# Patient Record
Sex: Female | Born: 1970 | Race: White | Hispanic: No | Marital: Married | Smoking: Never smoker
Health system: Southern US, Community
[De-identification: ages and names within clinical notes are randomized; demographics above are authoritative.]

## PROBLEM LIST (undated history)

## (undated) DIAGNOSIS — G4730 Sleep apnea, unspecified: Secondary | ICD-10-CM

## (undated) DIAGNOSIS — E079 Disorder of thyroid, unspecified: Secondary | ICD-10-CM

## (undated) DIAGNOSIS — E119 Type 2 diabetes mellitus without complications: Secondary | ICD-10-CM

## (undated) DIAGNOSIS — I10 Essential (primary) hypertension: Secondary | ICD-10-CM

## (undated) DIAGNOSIS — E039 Hypothyroidism, unspecified: Secondary | ICD-10-CM

## (undated) DIAGNOSIS — M797 Fibromyalgia: Secondary | ICD-10-CM

## (undated) HISTORY — DX: Hypothyroidism, unspecified: E03.9

## (undated) HISTORY — DX: Essential (primary) hypertension: I10

## (undated) HISTORY — DX: Type 2 diabetes mellitus without complications: E11.9

## (undated) HISTORY — PX: ACHILLES TENDON REPAIR: SUR1153

## (undated) HISTORY — PX: LAPAROSCOPIC ENDOMETRIOSIS FULGURATION: SUR769

## (undated) HISTORY — PX: CARDIAC CATHETERIZATION: SHX172

## (undated) HISTORY — PX: PLANTAR FASCIA SURGERY: SHX746

## (undated) HISTORY — PX: ABDOMINAL HYSTERECTOMY: SHX81

## (undated) HISTORY — PX: PARTIAL HYSTERECTOMY: SHX80

## (undated) HISTORY — DX: Fibromyalgia: M79.7

## (undated) HISTORY — DX: Disorder of thyroid, unspecified: E07.9

## (undated) HISTORY — PX: CHOLECYSTECTOMY: SHX55

---

## 1999-11-14 ENCOUNTER — Other Ambulatory Visit: Admission: RE | Admit: 1999-11-14 | Discharge: 1999-11-14 | Payer: Self-pay | Admitting: Obstetrics & Gynecology

## 2001-08-25 ENCOUNTER — Inpatient Hospital Stay (HOSPITAL_COMMUNITY): Admission: RE | Admit: 2001-08-25 | Discharge: 2001-08-26 | Payer: Self-pay | Admitting: Obstetrics & Gynecology

## 2010-07-14 ENCOUNTER — Emergency Department: Payer: Self-pay | Admitting: Emergency Medicine

## 2010-07-16 ENCOUNTER — Inpatient Hospital Stay: Payer: Self-pay | Admitting: Surgery

## 2010-08-16 ENCOUNTER — Ambulatory Visit: Payer: Self-pay | Admitting: Surgery

## 2010-09-12 ENCOUNTER — Ambulatory Visit: Payer: Self-pay | Admitting: Surgery

## 2010-09-12 ENCOUNTER — Inpatient Hospital Stay: Payer: Self-pay | Admitting: Internal Medicine

## 2010-09-19 ENCOUNTER — Inpatient Hospital Stay: Payer: Self-pay | Admitting: Surgery

## 2010-09-20 LAB — PATHOLOGY REPORT

## 2011-08-12 HISTORY — PX: COLON SURGERY: SHX602

## 2012-08-09 ENCOUNTER — Emergency Department: Payer: Self-pay | Admitting: Emergency Medicine

## 2012-08-09 LAB — URINALYSIS, COMPLETE
Glucose,UR: NEGATIVE mg/dL (ref 0–75)
Leukocyte Esterase: NEGATIVE
Nitrite: NEGATIVE
Protein: NEGATIVE
RBC,UR: 1 /HPF (ref 0–5)
WBC UR: <1 /HPF (ref 0–5)

## 2012-08-09 LAB — COMPREHENSIVE METABOLIC PANEL
Albumin: 4.1 g/dL (ref 3.4–5.0)
Anion Gap: 10 (ref 7–16)
BUN: 13 mg/dL (ref 7–18)
Bilirubin,Total: 0.3 mg/dL (ref 0.2–1.0)
Chloride: 103 mmol/L (ref 98–107)
Creatinine: 0.95 mg/dL (ref 0.60–1.30)
EGFR (African American): >60
EGFR (Non-African Amer.): >60
Glucose: 116 mg/dL — ABNORMAL HIGH (ref 65–99)
Potassium: 3.8 mmol/L (ref 3.5–5.1)
SGOT(AST): 28 U/L (ref 15–37)
SGPT (ALT): 35 U/L (ref 12–78)
Total Protein: 8.9 g/dL — ABNORMAL HIGH (ref 6.4–8.2)

## 2012-08-09 LAB — CBC
HCT: 46.1 % (ref 35.0–47.0)
HGB: 16 g/dL (ref 12.0–16.0)
MCH: 29.4 pg (ref 26.0–34.0)
MCHC: 34.7 g/dL (ref 32.0–36.0)
MCV: 85 fL (ref 80–100)
Platelet: 319 10*3/uL (ref 150–440)
RDW: 14 % (ref 11.5–14.5)
WBC: 18.5 10*3/uL — ABNORMAL HIGH (ref 3.6–11.0)

## 2012-08-09 LAB — LIPASE, BLOOD: Lipase: 233 U/L (ref 73–393)

## 2012-08-11 HISTORY — PX: HERNIA REPAIR: SHX51

## 2012-08-30 ENCOUNTER — Ambulatory Visit: Payer: Self-pay | Admitting: Surgery

## 2012-08-30 LAB — CBC WITH DIFFERENTIAL/PLATELET
Basophil #: 0 10*3/uL (ref 0.0–0.1)
Basophil %: 0.4 %
Eosinophil #: 0.2 10*3/uL (ref 0.0–0.7)
Eosinophil %: 1.4 %
HCT: 42.5 % (ref 35.0–47.0)
Lymphocyte #: 2.9 10*3/uL (ref 1.0–3.6)
Lymphocyte %: 27.1 %
MCHC: 33.6 g/dL (ref 32.0–36.0)
Monocyte #: 0.7 x10 3/mm (ref 0.2–0.9)
Neutrophil #: 7 10*3/uL — ABNORMAL HIGH (ref 1.4–6.5)
Platelet: 271 10*3/uL (ref 150–440)
RBC: 4.95 10*6/uL (ref 3.80–5.20)
WBC: 10.8 10*3/uL (ref 3.6–11.0)

## 2012-08-30 LAB — BASIC METABOLIC PANEL
Anion Gap: 5 — ABNORMAL LOW (ref 7–16)
Calcium, Total: 10.2 mg/dL — ABNORMAL HIGH (ref 8.5–10.1)
Chloride: 104 mmol/L (ref 98–107)
Creatinine: 0.82 mg/dL (ref 0.60–1.30)
Glucose: 118 mg/dL — ABNORMAL HIGH (ref 65–99)
Sodium: 139 mmol/L (ref 136–145)

## 2012-09-08 ENCOUNTER — Inpatient Hospital Stay: Payer: Self-pay | Admitting: Surgery

## 2012-09-08 LAB — CREATININE, SERUM
Creatinine: 0.99 mg/dL (ref 0.60–1.30)
EGFR (African American): >60

## 2012-09-09 LAB — CBC WITH DIFFERENTIAL/PLATELET
Basophil %: 0.2 %
HCT: 35 % (ref 35.0–47.0)
HGB: 11.7 g/dL — ABNORMAL LOW (ref 12.0–16.0)
Lymphocyte %: 11.5 %
MCH: 29 pg (ref 26.0–34.0)
MCHC: 33.6 g/dL (ref 32.0–36.0)
MCV: 86 fL (ref 80–100)
Monocyte #: 1.1 x10 3/mm — ABNORMAL HIGH (ref 0.2–0.9)
Monocyte %: 7.8 %
Platelet: 255 10*3/uL (ref 150–440)
WBC: 14.6 10*3/uL — ABNORMAL HIGH (ref 3.6–11.0)

## 2012-09-09 LAB — BASIC METABOLIC PANEL
Chloride: 103 mmol/L (ref 98–107)
EGFR (African American): >60
Sodium: 136 mmol/L (ref 136–145)

## 2012-09-13 LAB — CREATININE, SERUM
Creatinine: 0.83 mg/dL (ref 0.60–1.30)
EGFR (African American): >60

## 2013-10-11 DIAGNOSIS — M779 Enthesopathy, unspecified: Secondary | ICD-10-CM

## 2014-11-10 DIAGNOSIS — R52 Pain, unspecified: Secondary | ICD-10-CM

## 2014-12-01 NOTE — Discharge Summary (Signed)
PATIENT NAME:  [name], Denise Daniels MR#:  [id number] DATE OF BIRTH:  1970/10/27  DATE OF ADMISSION:  09/08/2012 DATE OF DISCHARGE:  09/13/2012  BRIEF HISTORY AND HOSPITAL COURSE: Denise Daniels is a 44 year old woman with a large ventral hernia admitted for elective ventral hernia repair. After preoperative preparation and informed consent she was taken to surgery on the morning of 09/08/2012 where she underwent a ventral hernia repair using Atrium C-QUR mesh and component separation. The procedure was uncomplicated. She had some mild pulmonary problems postoperatively from the increased abdominal pressure and her underlying lung disease. JP drains were placed which drained a moderate amount of fluid postoperatively. She had very slow return of bowel function. Pain control was an issue, but she has slowly improved over the last 48 hours. Her wounds look good and there is no sign of any infection or recurrence. Drainage is decreasing but we will send her home with her JP drains in place. She will be followed in the office in 3 to 5 days' time.   DISCHARGE MEDICATIONS: 1. Hydrochlorothiazide 25 mg/triamterene 37.5 mg p.o. q. day. 2. Levothyroxine 0.125 mg p.o. q. day.  3. Effexor-XR 75 mg p.o. at bedtime. 4. Atenolol 25 mg p.o. at bedtime. 5. Carisoprodol 350 mg p.o. q. day p.r.n. 6. Percocet 5/325 mg for postoperative pain control.  FINAL DISCHARGE DIAGNOSIS: Ventral hernia.   SURGERY: Ventral hernia repair. ____________________________ Micheline Maze, MD rle:sb D: 09/13/2012 09:00:52 ET    T: 09/13/2012 12:11:30 ET       JOB#: [id number] cc: Rodena Goldmann III, MD, <Dictator> Dr. Paulita Cradle MD ELECTRONICALLY SIGNED 09/22/2012 18:04

## 2014-12-01 NOTE — Op Note (Signed)
PATIENT NAME:  Denise Daniels, Denise Daniels MR#:  [id number] DATE OF BIRTH:  09/01/1970  DATE OF PROCEDURE:  09/07/2012  PREOPERATIVE DIAGNOSIS: Ventral hernia.   POSTOPERATIVE DIAGNOSIS: Ventral hernia.   OPERATION: Ventral hernia repair with component separation.   SURGEON: Rodena Goldmann, M.D.   ASSISTANT: [place]:, PA student.   ANESTHESIA: General.   OPERATIVE PROCEDURE: With the patient in the supine position after the induction of appropriate general anesthesia, the patient's abdomen was prepped with ChloraPrep and draped with sterile towels. The previous upper midline incision was excised as it had become significantly distended over time. This was carried down through the subcutaneous tissue with Bovie electrocautery. The sac was identified and dissected back to its origin in the abdominal wall. The defect was quite large and so extensive that subcutaneous dissection was required. Once the complete sac was isolated, the fascia identified and sides of the sac was opened, taken back to the anterior abdominal wall and removed. Multiple adhesions to the colon and omentum were taken down. Minimus preserved. Because of the large defect measured at 15 x 12 cm, the anterior abdominal wall components were separated laterally to provide for release of the anterior fascia. The idea behind this maneuver was designed to provide mobility to the anterior abdominal wall to allow the natural tissue to cover the mesh.  Atrium C-QUR mesh was brought to the table. A 25 x 20 piece was utilized overlapping in every direction. The mesh was sutured in place with vertical mattress sutures of 0 Prolene. The fascia was then closed over the mesh using a figure-of-eight suture of 0 Maxon.   The area was copiously irrigated. No significant bleeding was encountered. Two round Blake drains were placed to control any drainage in the dead space. Drains were secured with 3-0 nylon. Skin staples were applied. Sterile dressings were applied. An  abdominal binder was placed. The patient was returned to the recovery room having tolerated the procedure well. Sponge, instrument and needle counts were correct x 2 in the operating room.      ____________________________ Rodena Goldmann III, MD rle:ct D: 09/07/2012 10:26:23 ET T: 09/07/2012 12:22:33 ET JOB#: [id number]  cc: Rodena Goldmann III, MD, <Dictator> Rodena Goldmann MD ELECTRONICALLY SIGNED 09/08/2012 6:08

## 2015-01-10 DIAGNOSIS — E119 Type 2 diabetes mellitus without complications: Secondary | ICD-10-CM

## 2015-01-10 HISTORY — DX: Type 2 diabetes mellitus without complications: E11.9

## 2015-09-06 DIAGNOSIS — Z139 Encounter for screening, unspecified: Secondary | ICD-10-CM

## 2015-09-17 ENCOUNTER — Ambulatory Visit: Payer: Self-pay | Admitting: Physician Assistant

## 2015-09-17 ENCOUNTER — Encounter: Payer: Self-pay | Admitting: Physician Assistant

## 2015-09-17 VITALS — BP 136/88 | HR 75 | Temp 98.2°F | Ht 65.5 in | Wt 287.8 lb

## 2015-09-17 DIAGNOSIS — R109 Unspecified abdominal pain: Secondary | ICD-10-CM

## 2015-09-17 DIAGNOSIS — E119 Type 2 diabetes mellitus without complications: Secondary | ICD-10-CM

## 2015-09-17 DIAGNOSIS — Z13220 Encounter for screening for lipoid disorders: Secondary | ICD-10-CM

## 2015-09-17 DIAGNOSIS — M797 Fibromyalgia: Secondary | ICD-10-CM

## 2015-09-17 DIAGNOSIS — Z131 Encounter for screening for diabetes mellitus: Secondary | ICD-10-CM

## 2015-09-17 DIAGNOSIS — R10A0 Flank pain, unspecified side: Secondary | ICD-10-CM

## 2015-09-17 DIAGNOSIS — G8929 Other chronic pain: Secondary | ICD-10-CM

## 2015-09-17 DIAGNOSIS — E039 Hypothyroidism, unspecified: Secondary | ICD-10-CM

## 2015-09-17 DIAGNOSIS — I10 Essential (primary) hypertension: Secondary | ICD-10-CM

## 2015-09-17 LAB — POCT URINALYSIS DIPSTICK
Bilirubin, UA: NEGATIVE
Blood, UA: NEGATIVE
Glucose, UA: NEGATIVE
KETONES UA: NEGATIVE
LEUKOCYTES UA: NEGATIVE
Nitrite, UA: NEGATIVE
PH UA: 7
PROTEIN UA: NEGATIVE
SPEC GRAV UA: 1.025
UROBILINOGEN UA: 0.2

## 2015-09-17 LAB — GLUCOSE, POCT (MANUAL RESULT ENTRY): POC GLUCOSE: 149 mg/dL — AB (ref 70–99)

## 2015-09-17 NOTE — Progress Notes (Signed)
BP 136/88 mmHg  Pulse 75  Temp(Src) 98.2 F (36.8 C)  Ht 5' 5.5" (1.664 m)  Wt 287 lb 12 oz (130.523 kg)  BMI 47.14 kg/m2  SpO2 96%   Subjective:    Patient ID: Denise Daniels, female    DOB: 1970/10/11, 45 y.o.   MRN: [id number]  HPI: Denise Daniels is a 45 y.o. female presenting on 09/17/2015 for New Patient (Initial Visit)   HPI previously seen at [facility].  She hasn't seen them since June 2016.  Pt uses amoxil "prn rash".    Pt has never had her a1c rechecked after starting the metformin. Her last OV was when she was started on the metformin.   Pt states cardiac cath was normal at some point in the past.  Pt wants to see gyn for pain after intercourse.  Pt is s/p hysterectomy.  Relevant past medical, surgical, family and social history reviewed and updated as indicated. Interim medical history since our last visit reviewed. Allergies and medications reviewed and updated.   Current outpatient prescriptions:  .  amoxicillin (AMOXIL) 250 MG capsule, Take 250 mg by mouth 2 (two) times daily as needed (for skin)., Disp: , Rfl:  .  atenolol (TENORMIN) 25 MG tablet, Take 25 mg by mouth daily., Disp: , Rfl:  .  carisoprodol (SOMA) 350 MG tablet, Take 350 mg by mouth daily., Disp: , Rfl:  .  ibuprofen (ADVIL,MOTRIN) 200 MG tablet, Take 400 mg by mouth 2 (two) times daily as needed., Disp: , Rfl:  .  levothyroxine (SYNTHROID, LEVOTHROID) 137 MCG tablet, Take 137 mcg by mouth daily before breakfast., Disp: , Rfl:  .  metFORMIN (GLUCOPHAGE) 500 MG tablet, Take by mouth daily before breakfast., Disp: , Rfl:  .  sertraline (ZOLOFT) 100 MG tablet, Take 100 mg by mouth daily., Disp: , Rfl:  .  traMADol (ULTRAM) 50 MG tablet, Take 50-100 mg by mouth every 8 (eight) hours as needed (Fiber Myalgia Flare)., Disp: , Rfl:  .  triamterene-hydrochlorothiazide (DYAZIDE) 37.5-25 MG capsule, Take 1 capsule by mouth daily., Disp: , Rfl:    Review of Systems  Constitutional: Positive  for appetite change and fatigue. Negative for fever, chills, diaphoresis and unexpected weight change.  HENT: Negative for congestion, dental problem, drooling, ear pain, facial swelling, hearing loss, mouth sores, sneezing, sore throat, trouble swallowing and voice change.   Eyes: Positive for visual disturbance. Negative for pain, discharge, redness and itching.  Respiratory: Negative for cough, choking, shortness of breath and wheezing.   Cardiovascular: Positive for palpitations and leg swelling. Negative for chest pain.  Gastrointestinal: Positive for abdominal pain, diarrhea and constipation. Negative for vomiting and blood in stool.  Endocrine: Positive for heat intolerance and polydipsia. Negative for cold intolerance.  Genitourinary: Negative for dysuria, hematuria and decreased urine volume.  Musculoskeletal: Positive for back pain, arthralgias and gait problem.  Skin: Positive for rash.  Allergic/Immunologic: Positive for environmental allergies.  Neurological: Positive for light-headedness and headaches. Negative for seizures and syncope.  Hematological: Negative for adenopathy.  Psychiatric/Behavioral: Positive for dysphoric mood and agitation. Negative for suicidal ideas. The patient is nervous/anxious.     Per HPI unless specifically indicated above     Objective:    BP 136/88 mmHg  Pulse 75  Temp(Src) 98.2 F (36.8 C)  Ht 5' 5.5" (1.664 m)  Wt 287 lb 12 oz (130.523 kg)  BMI 47.14 kg/m2  SpO2 96%  Wt Readings from Last 3 Encounters:  09/17/15 287  lb 12 oz (130.523 kg)    Physical Exam  Constitutional: She is oriented to person, place, and time. She appears well-developed and well-nourished.  HENT:  Head: Normocephalic and atraumatic.  Mouth/Throat: Oropharynx is clear and moist. No oropharyngeal exudate.  Eyes: Conjunctivae and EOM are normal. Pupils are equal, round, and reactive to light.  Neck: Neck supple. No thyromegaly present.  Cardiovascular: Normal rate  and regular rhythm.   Pulmonary/Chest: Effort normal and breath sounds normal.  Abdominal: Soft. Bowel sounds are normal. She exhibits no mass. There is no hepatosplenomegaly. There is no tenderness.  Morbidly obese  Musculoskeletal: She exhibits no edema.  Lymphadenopathy:    She has no cervical adenopathy.  Neurological: She is alert and oriented to person, place, and time. Gait normal.  Skin: Skin is warm and dry.  Psychiatric: She has a normal mood and affect. Her behavior is normal.  Vitals reviewed.   Results for orders placed or performed in visit on 09/17/15  POCT Glucose (CBG)  Result Value Ref Range   POC Glucose 149 (A) 70 - 99 mg/dl      Assessment & Plan:   Encounter Diagnoses  Name Primary?  . Type 2 diabetes mellitus without complication, unspecified long term insulin use status ([place]) Yes  . Flank pain   . Hypothyroidism, unspecified hypothyroidism type   . Essential hypertension, benign   . Chronic pain   . Screening for diabetes mellitus   . Morbid obesity, unspecified obesity type ([place])   . Screening cholesterol level   . Fibromyalgia     -Refer to [facility] for fibromyalgia -Get fasting labs drawn tomorrow -Pt given card for her to Call [facility] for [place] services -continue current meds -F/u 1 mo -foot exam at f/u appointment in one month

## 2015-09-18 LAB — CBC WITH DIFFERENTIAL/PLATELET
BASOS PCT: 0 % (ref 0–1)
Basophils Absolute: 0 10*3/uL (ref 0.0–0.1)
Eosinophils Absolute: 0.1 10*3/uL (ref 0.0–0.7)
Eosinophils Relative: 1 % (ref 0–5)
HEMATOCRIT: 42.6 % (ref 36.0–46.0)
HEMOGLOBIN: 13.8 g/dL (ref 12.0–15.0)
Lymphocytes Relative: 26 % (ref 12–46)
Lymphs Abs: 2.7 10*3/uL (ref 0.7–4.0)
MCH: 28 pg (ref 26.0–34.0)
MCHC: 32.4 g/dL (ref 30.0–36.0)
MCV: 86.6 fL (ref 78.0–100.0)
MONO ABS: 0.7 10*3/uL (ref 0.1–1.0)
MONOS PCT: 7 % (ref 3–12)
MPV: 9.3 fL (ref 8.6–12.4)
NEUTROS ABS: 6.7 10*3/uL (ref 1.7–7.7)
Neutrophils Relative %: 66 % (ref 43–77)
Platelets: 266 10*3/uL (ref 150–400)
RBC: 4.92 MIL/uL (ref 3.87–5.11)
RDW: 14.6 % (ref 11.5–15.5)
WBC: 10.2 10*3/uL (ref 4.0–10.5)

## 2015-09-19 LAB — LIPID PANEL
CHOL/HDL RATIO: 5.4 ratio — AB (ref <=5.0)
Cholesterol: 194 mg/dL (ref 125–200)
HDL: 36 mg/dL — ABNORMAL LOW (ref >=46)
LDL Cholesterol: 130 mg/dL — ABNORMAL HIGH (ref <130)
Triglycerides: 142 mg/dL (ref <150)
VLDL: 28 mg/dL (ref <30)

## 2015-09-19 LAB — COMPLETE METABOLIC PANEL WITH GFR
ALBUMIN: 4.2 g/dL (ref 3.6–5.1)
ALK PHOS: 101 U/L (ref 33–115)
ALT: 20 U/L (ref 6–29)
AST: 20 U/L (ref 10–30)
BUN: 18 mg/dL (ref 7–25)
CO2: 30 mmol/L (ref 20–31)
Calcium: 10 mg/dL (ref 8.6–10.2)
Chloride: 101 mmol/L (ref 98–110)
Creat: 0.92 mg/dL (ref 0.50–1.10)
GFR, Est African American: 88 mL/min (ref >=60)
GFR, Est Non African American: 76 mL/min (ref >=60)
GLUCOSE: 143 mg/dL — AB (ref 65–99)
POTASSIUM: 4.4 mmol/L (ref 3.5–5.3)
SODIUM: 139 mmol/L (ref 135–146)
TOTAL PROTEIN: 7.2 g/dL (ref 6.1–8.1)
Total Bilirubin: 0.4 mg/dL (ref 0.2–1.2)

## 2015-09-19 LAB — TSH: TSH: 3.42 mIU/L

## 2015-09-19 LAB — MICROALBUMIN, URINE: MICROALB UR: 0.8 mg/dL

## 2015-09-19 LAB — HEMOGLOBIN A1C
HEMOGLOBIN A1C: 6.9 % — AB (ref <5.7)
MEAN PLASMA GLUCOSE: 151 mg/dL — AB (ref <117)

## 2015-09-26 NOTE — Congregational Nurse Program (Signed)
Congregational Nurse Program Note  Date of Encounter: 09/06/2015  Past Medical History: Past Medical History  Diagnosis Date  . Hypertension   . Diabetes mellitus without complication ([place]) [id number]  . Fibromyalgia   . Thyroid disease   . Hypothyroidism     Encounter Details:     CNP Questionnaire - 09/06/15 0925    Patient Demographics   Is this a new or existing patient? New   Patient is considered a/an Not Applicable   Race Caucasian/White   Patient Assistance   Location of Patient Assistance [facility], [place]   Patient's financial/insurance status Low Income   Uninsured Patient Yes   Interventions Counseled to make appt. with provider;Assisted patient in making appt.;Averted from ED/Urgent Care   Patient referred to apply for the following financial assistance [place] insecurities addressed Referred to food bank or resource   Transportation assistance No   Assistance securing medications No   Educational health offerings Chronic disease;Diabetes;Exercise/physical activity;Hypertension;Nutrition;Navigating the healthcare system;Behavioral health   Encounter Details   Primary purpose of visit Chronic Illness/Condition Visit;Navigating the Healthcare System   Was an Emergency Department visit averted? Yes   Does patient have a medical provider? No   Patient referred to Area Agency;Clinic;Establish PCP;Medication Assistance Programs   Was a mental health screening completed? (GAINS tool) Yes   Was a mental health referral made? Yes   Does patient have dental issues? No   Does patient have vision issues? Yes   Was a vision referral made? No   Since previous encounter, have you referred patient for abnormal blood pressure that resulted in a new diagnosis or medication change? No   Since previous encounter, have you referred patient for abnormal blood glucose that resulted in a new diagnosis or medication change? No         Amb Nursing  Assessment - 09/06/15 0930    Pre-visit preparation   Pre-visit preparation completed No   Pain Assessment   Pain Assessment 0-10   Pain Score 3    Pain Type Chronic pain   Pain Location Flank   Pain Orientation Left   Pain Descriptors / Indicators Aching   Pain Onset More than a month ago   Pain Frequency Constant   Pain Relieving Factors rest   Effect of Pain on Daily Activities history of chronic pain, fibromyalgia. makes doing ADLs difficult   Nutrition Screen   Diabetes Yes   CBG done? No   Did pt. bring in CBG monitor from home? No   Functional Status   Activities of Daily Living Independent   Ambulation Independent   Medication Administration Independent   Home Management Independent   Risk/Barriers  Assessment   Barriers to Care Management & Learning Emotional barrier;Mental health   Psychosocial Barriers Financial need;Uninsured/under-insured;Transportation   Abuse/Neglect Assessment   Do you feel unsafe in your current relationship? Yes   Do you feel physically threatened by others? No   Anyone hurting you at home, work, or school? No   Unable to ask? No   Information provided on Community resources Yes;Referral to Social worker  referral to MSW intern of [place]. client to follow up with intern on Feb. 2,2017   Language Assistant   Interpreter Needed? No     New client to [facility] program. Client does receive food stamps, and is currently working with an attorney in application for disability. Client currently lives with a friend and states housing there is stable and safe at present. States  friend helps with transportation and bills. She does report a need for further food assistance. Referral made today with the [employer] and application for assistance made. Last medical provider seen was in June of 2016 at [facility] family practice per client. Review of systems assesed. Client currently reports Diabetes , HTN , hypothyroidism, fibromyalgia, GERD. She  currently has medications, she states that the former clinic helped with meds and that her friend helps her with the 4$ meds at [place]. Current med list per client: Triam/HCTZ, levothyroxin, metformin, Atenolol, tramadol, Sertraline, Soma, zantac, advil prn and amoxycillin prn skin rash. PMH: Colon resection in 2013 with abdominal muscle wall repair with mesh in 2014. Gallbladder removal 1996, Paritial hysterectomy 14 years ago, surgery for endometriosis, bilateral plantar fascitis surgery 92,95. Heart cath unknown date, HTN, Fibromyalgia, DM. Client states currently she is unable to afford to go to her previous medical provider due to know insurance or income. Today client presents with complaints of swelling of hands and feet after being up, some extreme weakness with activity, general all over muscle pain, vision difficulty reading that reading glasses have not helped. Acid reflux, complaints of feeling like her bladder doesn't fully empty and that she is up 2 -3 times a night to urinate. Client denies current thoughts of sucide, but reports that over the past 2 mos increased feelings of depression., Isolating herself from others, not eating well, difficulty doing ADLs. Reports increased anxiety with finances, medical issues . Will refer client to MSW intern , appointment made for February 2nd to meet with [facility]. Referral made for PCP with [facility] and appointment secured for February 6th at 9:15am, appointment  Discussed with client diet for control of DM as well as HTN, dicussed less salt in diet and well as decreasing fried, fatty foods and about eating a well balanced diet of vegetables , lean protein and decreasing amount of carbohydrates with meals. [facility] MSW intern discussed basic relaxation techniques. Discussed with client the need for following up with appointments and that if transportation is needed for medical appointments to contact [place] for assistance. Client  states understanding. Also discussed the need for mental health provider , [facility] contact information given to client and she wants to further discuss that when she follows up with MSW intern next week. Will follow up as needed and after initial appointment. Client has RN contact information. Encouraged client to also reapply at [place] for medicaid.

## 2015-10-15 ENCOUNTER — Ambulatory Visit: Payer: Self-pay | Admitting: Physician Assistant

## 2015-10-17 ENCOUNTER — Encounter: Payer: Self-pay | Admitting: Physician Assistant

## 2015-11-26 ENCOUNTER — Ambulatory Visit: Payer: Self-pay | Admitting: Physician Assistant

## 2015-12-03 ENCOUNTER — Ambulatory Visit: Payer: Self-pay | Admitting: Physician Assistant

## 2015-12-03 ENCOUNTER — Encounter: Payer: Self-pay | Admitting: Physician Assistant

## 2015-12-03 VITALS — BP 124/78 | HR 68 | Temp 98.1°F | Ht 65.5 in | Wt 290.0 lb

## 2015-12-03 DIAGNOSIS — I10 Essential (primary) hypertension: Secondary | ICD-10-CM

## 2015-12-03 DIAGNOSIS — E785 Hyperlipidemia, unspecified: Secondary | ICD-10-CM | POA: Insufficient documentation

## 2015-12-03 DIAGNOSIS — G8929 Other chronic pain: Secondary | ICD-10-CM

## 2015-12-03 DIAGNOSIS — E039 Hypothyroidism, unspecified: Secondary | ICD-10-CM

## 2015-12-03 DIAGNOSIS — E119 Type 2 diabetes mellitus without complications: Secondary | ICD-10-CM

## 2015-12-03 MED ORDER — LOVASTATIN 20 MG PO TABS: 20.0000 mg | ORAL_TABLET | Freq: Every day | ORAL | Status: DC

## 2015-12-03 NOTE — Progress Notes (Signed)
BP 124/78 mmHg  Pulse 68  Temp(Src) 98.1 F (36.7 C)  Ht 5' 5.5" (1.664 m)  Wt 290 lb (131.543 kg)  BMI 47.51 kg/m2  SpO2 98%   Subjective:    Patient ID: Denise Daniels, female    DOB: November 28, 1970, 45 y.o.   MRN: [id number]  HPI: Denise Daniels is a 45 y.o. female presenting on 12/03/2015 for Diabetes; Thyroid Problem; Pain; and Mental Health Problem   HPI   Pt has not gone to DM education yet Pt going to Rheumatology in July for chronic pain   Relevant past medical, surgical, family and social history reviewed and updated as indicated. Interim medical history since our last visit reviewed. Allergies and medications reviewed and updated.   Current outpatient prescriptions:  .  amoxicillin (AMOXIL) 250 MG capsule, Take 250 mg by mouth 2 (two) times daily as needed (for skin)., Disp: , Rfl:  .  atenolol (TENORMIN) 25 MG tablet, Take 25 mg by mouth daily., Disp: , Rfl:  .  carisoprodol (SOMA) 350 MG tablet, Take 350 mg by mouth daily., Disp: , Rfl:  .  ibuprofen (ADVIL,MOTRIN) 200 MG tablet, Take 400 mg by mouth 2 (two) times daily as needed., Disp: , Rfl:  .  levothyroxine (SYNTHROID, LEVOTHROID) 137 MCG tablet, Take 137 mcg by mouth daily before breakfast., Disp: , Rfl:  .  metFORMIN (GLUCOPHAGE) 500 MG tablet, Take by mouth daily before breakfast., Disp: , Rfl:  .  sertraline (ZOLOFT) 100 MG tablet, Take 100 mg by mouth daily., Disp: , Rfl:  .  traMADol (ULTRAM) 50 MG tablet, Take 50-100 mg by mouth daily as needed (Fiber Myalgia Flare). , Disp: , Rfl:  .  triamterene-hydrochlorothiazide (DYAZIDE) 37.5-25 MG capsule, Take 1 capsule by mouth daily., Disp: , Rfl:    Review of Systems  Constitutional: Positive for fatigue. Negative for fever, chills, diaphoresis, appetite change and unexpected weight change.  HENT: Negative for congestion, dental problem, drooling, ear pain, facial swelling, hearing loss, mouth sores, sneezing, sore throat, trouble swallowing and voice change.    Eyes: Positive for visual disturbance. Negative for pain, discharge, redness and itching.  Respiratory: Negative for cough, choking, shortness of breath and wheezing.   Cardiovascular: Positive for palpitations and leg swelling. Negative for chest pain.  Gastrointestinal: Negative for vomiting, abdominal pain, diarrhea, constipation and blood in stool.  Endocrine: Positive for heat intolerance. Negative for cold intolerance and polydipsia.  Genitourinary: Negative for dysuria, hematuria and decreased urine volume.  Musculoskeletal: Positive for back pain, arthralgias and gait problem.  Skin: Positive for rash.  Allergic/Immunologic: Negative for environmental allergies.  Neurological: Negative for seizures, syncope, light-headedness and headaches.  Hematological: Negative for adenopathy.  Psychiatric/Behavioral: Positive for dysphoric mood and agitation. Negative for suicidal ideas. The patient is nervous/anxious.     Per HPI unless specifically indicated above     Objective:    BP 124/78 mmHg  Pulse 68  Temp(Src) 98.1 F (36.7 C)  Ht 5' 5.5" (1.664 m)  Wt 290 lb (131.543 kg)  BMI 47.51 kg/m2  SpO2 98%  Wt Readings from Last 3 Encounters:  12/03/15 290 lb (131.543 kg)  09/17/15 287 lb 12 oz (130.523 kg)    Physical Exam  Constitutional: She is oriented to person, place, and time. She appears well-developed and well-nourished.  HENT:  Head: Normocephalic and atraumatic.  Neck: Neck supple.  Cardiovascular: Normal rate and regular rhythm.   Pulmonary/Chest: Effort normal and breath sounds normal.  Abdominal: Soft. Bowel sounds  are normal. She exhibits no mass. There is no hepatosplenomegaly. There is no tenderness.  obese  Musculoskeletal: She exhibits no edema.  Lymphadenopathy:    She has no cervical adenopathy.  Neurological: She is alert and oriented to person, place, and time.  Skin: Skin is warm and dry.  Significant sun exposure all over body  Psychiatric: She has  a normal mood and affect. Her behavior is normal.  Vitals reviewed.  Foot exam done  Results for orders placed or performed in visit on 09/17/15  HgB A1c  Result Value Ref Range   Hgb A1c MFr Bld 6.9 (H) <5.7 %   Mean Plasma Glucose 151 (H) <117 mg/dL  Lipid Profile  Result Value Ref Range   Cholesterol 194 125 - 200 mg/dL   Triglycerides 142 <150 mg/dL   HDL 36 (L) >=46 mg/dL   Total CHOL/HDL Ratio 5.4 (H) <=5.0 Ratio   VLDL 28 <30 mg/dL   LDL Cholesterol 130 (H) <130 mg/dL  COMPLETE METABOLIC PANEL WITH GFR  Result Value Ref Range   Sodium 139 135 - 146 mmol/L   Potassium 4.4 3.5 - 5.3 mmol/L   Chloride 101 98 - 110 mmol/L   CO2 30 20 - 31 mmol/L   Glucose, Bld 143 (H) 65 - 99 mg/dL   BUN 18 7 - 25 mg/dL   Creat 0.92 0.50 - 1.10 mg/dL   Total Bilirubin 0.4 0.2 - 1.2 mg/dL   Alkaline Phosphatase 101 33 - 115 U/L   AST 20 10 - 30 U/L   ALT 20 6 - 29 U/L   Total Protein 7.2 6.1 - 8.1 g/dL   Albumin 4.2 3.6 - 5.1 g/dL   Calcium 10.0 8.6 - 10.2 mg/dL   GFR, Est African American 88 >=60 mL/min   GFR, Est Non African American 76 >=60 mL/min  CBC w/Diff/Platelet  Result Value Ref Range   WBC 10.2 4.0 - 10.5 K/uL   RBC 4.92 3.87 - 5.11 MIL/uL   Hemoglobin 13.8 12.0 - 15.0 g/dL   HCT 42.6 36.0 - 46.0 %   MCV 86.6 78.0 - 100.0 fL   MCH 28.0 26.0 - 34.0 pg   MCHC 32.4 30.0 - 36.0 g/dL   RDW 14.6 11.5 - 15.5 %   Platelets 266 150 - 400 K/uL   MPV 9.3 8.6 - 12.4 fL   Neutrophils Relative % 66 43 - 77 %   Neutro Abs 6.7 1.7 - 7.7 K/uL   Lymphocytes Relative 26 12 - 46 %   Lymphs Abs 2.7 0.7 - 4.0 K/uL   Monocytes Relative 7 3 - 12 %   Monocytes Absolute 0.7 0.1 - 1.0 K/uL   Eosinophils Relative 1 0 - 5 %   Eosinophils Absolute 0.1 0.0 - 0.7 K/uL   Basophils Relative 0 0 - 1 %   Basophils Absolute 0.0 0.0 - 0.1 K/uL   Smear Review Criteria for review not met   Microalbumin, urine  Result Value Ref Range   Microalb, Ur 0.8 Not estab mg/dL  TSH  Result Value Ref Range    TSH 3.42 mIU/L  POCT Glucose (CBG)  Result Value Ref Range   POC Glucose 149 (A) 70 - 99 mg/dl  POCT Urinalysis Dipstick  Result Value Ref Range   Color, UA LIGHT YELLOW    Clarity, UA clear    Glucose, UA N    Bilirubin, UA N    Ketones, UA N    Spec Grav, UA 1.025  Blood, UA N    pH, UA 7.0    Protein, UA N    Urobilinogen, UA 0.2    Nitrite, UA N    Leukocytes, UA Negative Negative      Assessment & Plan:   Encounter Diagnoses  Name Primary?  . Essential hypertension, benign Yes  . Type 2 diabetes mellitus without complication, unspecified long term insulin use status ([place])   . Hypothyroidism, unspecified hypothyroidism type   . Morbid obesity, unspecified obesity type ([place])   . Hyperlipidemia   . Chronic pain     -Reviewed labs with pt  -Continue current meds -rx Lovastatin for lipids. Counseled and gave Lowfat diet sheet -pt to attend DM education class -refer for Diabetic eye exam -counseled pt to stay out of tanning bed/wear sunscreen outside -F/u 3 months.  RTO sooner prn

## 2015-12-03 NOTE — Patient Instructions (Signed)

## 2016-02-21 ENCOUNTER — Other Ambulatory Visit: Payer: Self-pay | Admitting: Obstetrics and Gynecology

## 2016-02-21 DIAGNOSIS — Z1231 Encounter for screening mammogram for malignant neoplasm of breast: Secondary | ICD-10-CM

## 2016-02-26 ENCOUNTER — Other Ambulatory Visit: Payer: Self-pay

## 2016-02-26 DIAGNOSIS — E119 Type 2 diabetes mellitus without complications: Secondary | ICD-10-CM

## 2016-02-26 DIAGNOSIS — E785 Hyperlipidemia, unspecified: Secondary | ICD-10-CM

## 2016-02-26 DIAGNOSIS — I10 Essential (primary) hypertension: Secondary | ICD-10-CM

## 2016-02-28 LAB — COMPLETE METABOLIC PANEL WITH GFR
ALT: 31 U/L — AB (ref 6–29)
AST: 28 U/L (ref 10–35)
Albumin: 4.1 g/dL (ref 3.6–5.1)
Alkaline Phosphatase: 91 U/L (ref 33–115)
BILIRUBIN TOTAL: 0.7 mg/dL (ref 0.2–1.2)
BUN: 17 mg/dL (ref 7–25)
CALCIUM: 10.3 mg/dL — AB (ref 8.6–10.2)
CO2: 27 mmol/L (ref 20–31)
CREATININE: 0.97 mg/dL (ref 0.50–1.10)
Chloride: 98 mmol/L (ref 98–110)
GFR, EST NON AFRICAN AMERICAN: 71 mL/min (ref >=60)
GFR, Est African American: 82 mL/min (ref >=60)
Glucose, Bld: 154 mg/dL — ABNORMAL HIGH (ref 65–99)
Potassium: 4 mmol/L (ref 3.5–5.3)
Sodium: 135 mmol/L (ref 135–146)
TOTAL PROTEIN: 7.2 g/dL (ref 6.1–8.1)

## 2016-02-28 LAB — LIPID PANEL
CHOLESTEROL: 173 mg/dL (ref 125–200)
HDL: 36 mg/dL — AB (ref >=46)
LDL CALC: 108 mg/dL (ref <130)
TRIGLYCERIDES: 143 mg/dL (ref <150)
Total CHOL/HDL Ratio: 4.8 Ratio (ref <=5.0)
VLDL: 29 mg/dL (ref <30)

## 2016-02-29 LAB — HEMOGLOBIN A1C
HEMOGLOBIN A1C: 7.3 % — AB (ref <5.7)
Mean Plasma Glucose: 163 mg/dL

## 2016-03-03 ENCOUNTER — Encounter: Payer: Self-pay | Admitting: Physician Assistant

## 2016-03-03 ENCOUNTER — Ambulatory Visit: Payer: Self-pay | Admitting: Physician Assistant

## 2016-03-03 VITALS — BP 122/80 | HR 68 | Temp 98.1°F | Ht 65.5 in | Wt 288.2 lb

## 2016-03-03 DIAGNOSIS — E785 Hyperlipidemia, unspecified: Secondary | ICD-10-CM

## 2016-03-03 DIAGNOSIS — R079 Chest pain, unspecified: Secondary | ICD-10-CM

## 2016-03-03 DIAGNOSIS — G8929 Other chronic pain: Secondary | ICD-10-CM

## 2016-03-03 DIAGNOSIS — E119 Type 2 diabetes mellitus without complications: Secondary | ICD-10-CM

## 2016-03-03 DIAGNOSIS — E039 Hypothyroidism, unspecified: Secondary | ICD-10-CM

## 2016-03-03 DIAGNOSIS — I10 Essential (primary) hypertension: Secondary | ICD-10-CM

## 2016-03-03 MED ORDER — PANTOPRAZOLE SODIUM 40 MG PO TBEC: 40.0000 mg | DELAYED_RELEASE_TABLET | Freq: Every day | ORAL | 3 refills | Status: DC

## 2016-03-03 MED ORDER — SIMVASTATIN 20 MG PO TABS: 20.0000 mg | ORAL_TABLET | Freq: Every day | ORAL | 4 refills | Status: DC

## 2016-03-03 NOTE — Patient Instructions (Signed)
GO TO DIABETES EDUCATION CLASS

## 2016-03-03 NOTE — Progress Notes (Signed)
BP 122/80 (BP Location: Left Arm, Patient Position: Sitting, Cuff Size: Large)   Pulse 68   Temp 98.1 F (36.7 C)   Ht 5' 5.5" (1.664 m)   Wt 288 lb 4 oz (130.7 kg)   SpO2 96%   BMI 47.24 kg/m    Subjective:    Patient ID: Denise Daniels, female    DOB: 06-Jan-1971, 45 y.o.   MRN: [id number]  HPI: Denise Daniels is a 45 y.o. female presenting on 03/03/2016 for Diabetes; Hyperlipidemia (pt states she d/c lovastatin 3 weeks ago due to it made her "do crazy things"); and Thyroid Problem   HPI   Pt has still not gone to diabetes education class  Pt says she thinks the lovastatin made her be a "bitch".  So she stopped it.  Pt states some DOE and chest feels tight. She also states heartburn- lots of belching and nausea.  She tried prilosec OTC, ranitidine and states no improvement  Pt isn't going to any MH place at this time.  She says she is taking these meds due to "fibromyalgia" pains.   Pt has appointment with dr in [place]/rheumatologist for this.   Pt states she had normal heart cath in 2012 in [place] thinks she is allergic to eating b/c she says her skin itches after she eats   Relevant past medical, surgical, family and social history reviewed and updated as indicated. Interim medical history since our last visit reviewed. Allergies and medications reviewed and updated.   Current Outpatient Prescriptions:  .  atenolol (TENORMIN) 25 MG tablet, Take 25 mg by mouth daily., Disp: , Rfl:  .  carisoprodol (SOMA) 350 MG tablet, Take 350 mg by mouth daily., Disp: , Rfl:  .  ibuprofen (ADVIL,MOTRIN) 200 MG tablet, Take 400 mg by mouth 2 (two) times daily as needed., Disp: , Rfl:  .  levothyroxine (SYNTHROID, LEVOTHROID) 137 MCG tablet, Take 137 mcg by mouth daily before breakfast., Disp: , Rfl:  .  metFORMIN (GLUCOPHAGE) 500 MG tablet, Take by mouth daily before breakfast., Disp: , Rfl:  .  sertraline (ZOLOFT) 100 MG tablet, Take 100 mg by mouth daily., Disp: , Rfl:  .  traMADol  (ULTRAM) 50 MG tablet, Take 50-100 mg by mouth daily as needed (Fiber Myalgia Flare). , Disp: , Rfl:  .  triamterene-hydrochlorothiazide (DYAZIDE) 37.5-25 MG capsule, Take 1 capsule by mouth daily., Disp: , Rfl:    Review of Systems  Constitutional: Positive for appetite change, diaphoresis and fatigue. Negative for chills, fever and unexpected weight change.  HENT: Negative for congestion, dental problem, drooling, ear pain, facial swelling, hearing loss, mouth sores, sneezing, sore throat, trouble swallowing and voice change.   Eyes: Negative for pain, discharge, redness, itching and visual disturbance.  Respiratory: Positive for shortness of breath. Negative for cough, choking and wheezing.   Cardiovascular: Positive for chest pain, palpitations and leg swelling.  Gastrointestinal: Negative for abdominal pain, blood in stool, constipation, diarrhea and vomiting.  Endocrine: Positive for heat intolerance. Negative for cold intolerance and polydipsia.  Genitourinary: Negative for decreased urine volume, dysuria and hematuria.  Musculoskeletal: Positive for arthralgias, back pain and gait problem.  Skin: Positive for rash.  Allergic/Immunologic: Negative for environmental allergies.  Neurological: Negative for seizures, syncope, light-headedness and headaches.  Hematological: Negative for adenopathy.  Psychiatric/Behavioral: Positive for dysphoric mood. Negative for agitation and suicidal ideas. The patient is nervous/anxious.     Per HPI unless specifically indicated above     Objective:  BP 122/80 (BP Location: Left Arm, Patient Position: Sitting, Cuff Size: Large)   Pulse 68   Temp 98.1 F (36.7 C)   Ht 5' 5.5" (1.664 m)   Wt 288 lb 4 oz (130.7 kg)   SpO2 96%   BMI 47.24 kg/m   Wt Readings from Last 3 Encounters:  03/03/16 288 lb 4 oz (130.7 kg)  12/03/15 290 lb (131.5 kg)  09/17/15 287 lb 12 oz (130.5 kg)    Physical Exam  Constitutional: She is oriented to person,  place, and time. She appears well-developed and well-nourished.  HENT:  Head: Normocephalic and atraumatic.  Neck: Neck supple.  Cardiovascular: Normal rate and regular rhythm.   Pulmonary/Chest: Effort normal and breath sounds normal.  Abdominal: Soft. Bowel sounds are normal. She exhibits no mass. There is no hepatosplenomegaly. There is no tenderness.  Musculoskeletal: She exhibits no edema.  Lymphadenopathy:    She has no cervical adenopathy.  Neurological: She is alert and oriented to person, place, and time.  Skin: Skin is warm and dry. Rash noted.  Over-tanned with chronic sun exposure damage  Psychiatric: She has a normal mood and affect. Her behavior is normal.  Vitals reviewed.    ekg- reviewed- sinus brady at 58bpm. No changes compared with previous EKG from 08/30/12  Results for orders placed or performed in visit on 02/26/16  Lipid Profile  Result Value Ref Range   Cholesterol 173 125 - 200 mg/dL   Triglycerides 143 <150 mg/dL   HDL 36 (L) >=46 mg/dL   Total CHOL/HDL Ratio 4.8 <=5.0 Ratio   VLDL 29 <30 mg/dL   LDL Cholesterol 108 <130 mg/dL  COMPLETE METABOLIC PANEL WITH GFR  Result Value Ref Range   Sodium 135 135 - 146 mmol/L   Potassium 4.0 3.5 - 5.3 mmol/L   Chloride 98 98 - 110 mmol/L   CO2 27 20 - 31 mmol/L   Glucose, Bld 154 (H) 65 - 99 mg/dL   BUN 17 7 - 25 mg/dL   Creat 0.97 0.50 - 1.10 mg/dL   Total Bilirubin 0.7 0.2 - 1.2 mg/dL   Alkaline Phosphatase 91 33 - 115 U/L   AST 28 10 - 35 U/L   ALT 31 (H) 6 - 29 U/L   Total Protein 7.2 6.1 - 8.1 g/dL   Albumin 4.1 3.6 - 5.1 g/dL   Calcium 10.3 (H) 8.6 - 10.2 mg/dL   GFR, Est African American 82 >=60 mL/min   GFR, Est Non African American 71 >=60 mL/min  HgB A1c  Result Value Ref Range   Hgb A1c MFr Bld 7.3 (H) <5.7 %   Mean Plasma Glucose 163 mg/dL      Assessment & Plan:    - go to diabetes education class -gave [facility] card to contact for her mood -rx simvastatin -lotion for itching- likely  dry skin causing itching, not eating -walking and weight loss for varicose veins -Rx protonix and GERD handout. Counseled on lifestyle changes -F/u 6 weeks.  RTO sooner prn

## 2016-03-26 ENCOUNTER — Encounter (HOSPITAL_COMMUNITY): Payer: Self-pay | Admitting: *Deleted

## 2016-03-26 ENCOUNTER — Telehealth (HOSPITAL_COMMUNITY): Payer: Self-pay | Admitting: *Deleted

## 2016-03-26 NOTE — Telephone Encounter (Signed)
Telephoned patient at home number and confirmed [facility] appointment for Thursday August 17 8:00.

## 2016-03-27 ENCOUNTER — Ambulatory Visit (HOSPITAL_COMMUNITY)
Admission: RE | Admit: 2016-03-27 | Discharge: 2016-03-27 | Disposition: A | Discharge status: Home / Self Care | Payer: Self-pay | Source: Ambulatory Visit | Attending: Obstetrics and Gynecology | Admitting: Obstetrics and Gynecology

## 2016-03-27 ENCOUNTER — Encounter (HOSPITAL_COMMUNITY): Payer: Self-pay | Admitting: *Deleted

## 2016-03-27 ENCOUNTER — Ambulatory Visit
Admission: RE | Admit: 2016-03-27 | Discharge: 2016-03-27 | Disposition: A | Discharge status: Home / Self Care | Payer: No Typology Code available for payment source | Source: Ambulatory Visit | Attending: Obstetrics and Gynecology | Admitting: Obstetrics and Gynecology

## 2016-03-27 VITALS — BP 116/74 | Temp 97.9°F | Ht 67.0 in | Wt 294.2 lb

## 2016-03-27 DIAGNOSIS — Z1239 Encounter for other screening for malignant neoplasm of breast: Secondary | ICD-10-CM

## 2016-03-27 DIAGNOSIS — Z1231 Encounter for screening mammogram for malignant neoplasm of breast: Secondary | ICD-10-CM

## 2016-03-27 NOTE — Progress Notes (Signed)
Complaints of mild bilateral breast tenderness around the tenth of each month.  Pap Smear:  Pap smear not completed today. Last Pap smear was 11/12/2015 at the free cervical cancer screening at [facility] and normal. Per patient has no history of an abnormal Pap smear. Patient has a history of a hysterectomy for fibroids, endometriosis, and AUB 15 years ago. Patient no longer needs Pap smears due to her history of a hysterectomy for benign reasons per BCCCP and ACOG guidelines. Last Pap smear result is in EPIC.  Physical exam: Breasts  Breasts symmetrical. No skin abnormalities bilateral breasts. No nipple retraction bilateral breasts. No nipple discharge bilateral breasts. No lymphadenopathy. No lumps palpated bilateral breasts. No complaints of pain or tenderness on exam. Referred patient to the [facility] for a screening mammogram. Appointment scheduled for Thursday, March 27, 2016 at [place].        Pelvic/Bimanual No Pap smear completed today since patient has a history of a hysterectomy for benign reasons. Pap smear not indicated per BCCCP guidelines.   Smoking History: Patient has never smoked.  Patient Navigation: Patient education provided. Access to services provided for patient through [facility] program.

## 2016-03-27 NOTE — Patient Instructions (Signed)
Explained breast self awareness to [name]. Patient did not need a Pap smear today due to patient has a history of a hysterectomy for benign reasons. Told patient that she no longer needs Pap smears due to her history of a hysterectomy for benign reasons. Referred patient to the [facility] for a screening mammogram. Appointment scheduled for Thursday, March 27, 2016 at [place]. Let patient know the [facility] will follow up with her within the next couple weeks with results of mammogram by letter or phone. Denise Daniels verbalized understanding.  [name], [name], RN 9:16 AM

## 2016-03-31 ENCOUNTER — Encounter (HOSPITAL_COMMUNITY): Payer: Self-pay | Admitting: *Deleted

## 2016-04-21 ENCOUNTER — Ambulatory Visit: Payer: Self-pay | Admitting: Physician Assistant

## 2016-07-14 ENCOUNTER — Other Ambulatory Visit: Payer: Self-pay | Admitting: Physician Assistant

## 2016-07-15 ENCOUNTER — Other Ambulatory Visit: Payer: Self-pay | Admitting: Physician Assistant

## 2016-07-28 ENCOUNTER — Ambulatory Visit: Payer: Self-pay | Admitting: Physician Assistant

## 2016-07-28 ENCOUNTER — Encounter: Payer: Self-pay | Admitting: Physician Assistant

## 2016-07-28 VITALS — BP 120/80 | HR 81 | Temp 97.7°F | Ht 65.5 in | Wt 292.2 lb

## 2016-07-28 DIAGNOSIS — R109 Unspecified abdominal pain: Secondary | ICD-10-CM

## 2016-07-28 DIAGNOSIS — R002 Palpitations: Secondary | ICD-10-CM

## 2016-07-28 DIAGNOSIS — G8929 Other chronic pain: Secondary | ICD-10-CM

## 2016-07-28 DIAGNOSIS — I10 Essential (primary) hypertension: Secondary | ICD-10-CM

## 2016-07-28 DIAGNOSIS — Z9119 Patient's noncompliance with other medical treatment and regimen: Secondary | ICD-10-CM

## 2016-07-28 DIAGNOSIS — E039 Hypothyroidism, unspecified: Secondary | ICD-10-CM

## 2016-07-28 DIAGNOSIS — Z91199 Patient's noncompliance with other medical treatment and regimen due to unspecified reason: Secondary | ICD-10-CM

## 2016-07-28 DIAGNOSIS — E119 Type 2 diabetes mellitus without complications: Secondary | ICD-10-CM

## 2016-07-28 DIAGNOSIS — E785 Hyperlipidemia, unspecified: Secondary | ICD-10-CM

## 2016-07-28 LAB — POCT URINALYSIS DIPSTICK
BILIRUBIN UA: NEGATIVE
Blood, UA: NEGATIVE
KETONES UA: NEGATIVE
Nitrite, UA: NEGATIVE
Protein, UA: NEGATIVE
SPEC GRAV UA: 1.01
Urobilinogen, UA: 0.2
pH, UA: 7

## 2016-07-28 LAB — GLUCOSE, POCT (MANUAL RESULT ENTRY): POC Glucose: 157 mg/dl — AB (ref 70–99)

## 2016-07-28 MED ORDER — ATENOLOL 50 MG PO TABS: 50.0000 mg | ORAL_TABLET | Freq: Every day | ORAL | 0 refills | Status: DC

## 2016-07-28 MED ORDER — SIMVASTATIN 20 MG PO TABS: 20.0000 mg | ORAL_TABLET | Freq: Every day | ORAL | 0 refills | Status: DC

## 2016-07-28 MED ORDER — PANTOPRAZOLE SODIUM 40 MG PO TBEC: 40.0000 mg | DELAYED_RELEASE_TABLET | Freq: Every day | ORAL | 3 refills | Status: DC

## 2016-07-28 NOTE — Progress Notes (Signed)
BP 120/80 (BP Location: Right Leg, Patient Position: Sitting, Cuff Size: Large)   Pulse 81   Temp 97.7 F (36.5 C) (Other (Comment))   Ht 5' 5.5" (1.664 m)   Wt 292 lb 3.2 oz (132.5 kg)   SpO2 96%   BMI 47.89 kg/m    Subjective:    Patient ID: Denise Daniels, female    DOB: 1971-02-17, 45 y.o.   MRN: [id number]  HPI: Denise Daniels is a 45 y.o. female presenting on 07/28/2016 for Follow-up   HPI   Pt last seen in office in July.  She was supposed to f/u in 6 weeks but she hasn't returned until today.    Pt has been seen by Rheumatology at [facility]   Pt states palpitaitons lately.  Has been taking extra atenolol.  Pt states no soda or caffeine.    Pt with similar compalint at last OV.    Pt states the protonix worked well and reqests refill.   Pt States stress recently- she says her mother is sick.  Pt also c/o flank pain.  Pt had same complaint at previous OV.  She says it is same.   Pt did not contact [facility] after last OV as recommended for counseling.    Relevant past medical, surgical, family and social history reviewed and updated as indicated. Interim medical history since our last visit reviewed. Allergies and medications reviewed and updated.   Current Outpatient Prescriptions:  .  atenolol (TENORMIN) 25 MG tablet, Take 25 mg by mouth daily., Disp: , Rfl:  .  carisoprodol (SOMA) 350 MG tablet, Take 350 mg by mouth daily., Disp: , Rfl:  .  ibuprofen (ADVIL,MOTRIN) 200 MG tablet, Take 400 mg by mouth 2 (two) times daily as needed., Disp: , Rfl:  .  levothyroxine (SYNTHROID, LEVOTHROID) 137 MCG tablet, Take 137 mcg by mouth daily before breakfast., Disp: , Rfl:  .  metFORMIN (GLUCOPHAGE) 500 MG tablet, Take by mouth daily before breakfast., Disp: , Rfl:  .  sertraline (ZOLOFT) 100 MG tablet, Take 100 mg by mouth daily., Disp: , Rfl:  .  simvastatin (ZOCOR) 20 MG tablet, Take 1 tablet (20 mg total) by mouth at bedtime., Disp: 30 tablet, Rfl: 4 .  traMADol (ULTRAM) 50  MG tablet, Take 50-100 mg by mouth every 8 (eight) hours as needed (Fiber Myalgia Flare). , Disp: , Rfl:  .  triamterene-hydrochlorothiazide (DYAZIDE) 37.5-25 MG capsule, Take 1 capsule by mouth daily., Disp: , Rfl:  .  pantoprazole (PROTONIX) 40 MG tablet, Take 1 tablet (40 mg total) by mouth daily. (Patient not taking: Reported on 07/28/2016), Disp: 30 tablet, Rfl: 3   Review of Systems  Constitutional: Positive for diaphoresis and fatigue. Negative for appetite change, chills, fever and unexpected weight change.  HENT: Negative for congestion, dental problem, drooling, ear pain, facial swelling, hearing loss, mouth sores, sneezing, sore throat, trouble swallowing and voice change.   Eyes: Negative for pain, discharge, redness, itching and visual disturbance.  Respiratory: Positive for shortness of breath. Negative for cough, choking and wheezing.   Cardiovascular: Positive for palpitations and leg swelling. Negative for chest pain.  Gastrointestinal: Negative for abdominal pain, blood in stool, constipation, diarrhea and vomiting.  Endocrine: Positive for heat intolerance. Negative for cold intolerance and polydipsia.  Genitourinary: Negative for decreased urine volume, dysuria and hematuria.  Musculoskeletal: Positive for arthralgias and back pain. Negative for gait problem.  Skin: Negative for rash.  Allergic/Immunologic: Negative for environmental allergies.  Neurological: Positive for  light-headedness and headaches. Negative for seizures and syncope.  Hematological: Negative for adenopathy.  Psychiatric/Behavioral: Positive for agitation and dysphoric mood. Negative for suicidal ideas. The patient is nervous/anxious.     Per HPI unless specifically indicated above     Objective:    BP 120/80 (BP Location: Right Leg, Patient Position: Sitting, Cuff Size: Large)   Pulse 81   Temp 97.7 F (36.5 C) (Other (Comment))   Ht 5' 5.5" (1.664 m)   Wt 292 lb 3.2 oz (132.5 kg)   SpO2 96%    BMI 47.89 kg/m   Wt Readings from Last 3 Encounters:  07/28/16 292 lb 3.2 oz (132.5 kg)  03/27/16 294 lb 3.2 oz (133.4 kg)  03/03/16 288 lb 4 oz (130.7 kg)    Physical Exam  Constitutional: She is oriented to person, place, and time. She appears well-developed and well-nourished.  HENT:  Head: Normocephalic and atraumatic.  Neck: Neck supple.  Cardiovascular: Normal rate and regular rhythm.   Pulmonary/Chest: Effort normal and breath sounds normal.  Abdominal: Soft. Bowel sounds are normal. She exhibits no mass. There is no hepatosplenomegaly. There is no tenderness.  Musculoskeletal: She exhibits no edema.  Lymphadenopathy:    She has no cervical adenopathy.  Neurological: She is alert and oriented to person, place, and time.  Skin: Skin is warm and dry.  Psychiatric: She has a normal mood and affect. Her behavior is normal.  Vitals reviewed.   EKG- SR no change from july    Assessment & Plan:    Encounter Diagnoses  Name Primary?  . Type 2 diabetes mellitus without complication, unspecified long term insulin use status ([place]) Yes  . Essential hypertension, benign   . Flank pain   . Palpitations   . Hyperlipidemia, unspecified hyperlipidemia type   . Other chronic pain   . Morbid obesity, unspecified obesity type ([place])   . Hypothyroidism, unspecified type   . Personal history of noncompliance with medical treatment, presenting hazards to health     -Discussed noncompliance with pt in reference to not seen since July.  Discussed that she Needs f/u sooner/more regularly in light of her many conditions. -pt to Get labs fasting labs drawn this week -pt is given rx protonix -Discussed Change to metoprolol for her palpitations.  Pt very vehement that she will not go on it because she tried it in the past and it made her weak. will Increase atenolol -[facility]ged [facility] to [facility] -F/u 6 wk.  RTO sooner prn  (The duration of this appointment visit was 30  minutes of face-to-face time with the patient.  Greater than 50% of this time was spent in counseling, explanation of diagnosis, planning of further management, and coordination of care.)

## 2016-07-29 ENCOUNTER — Ambulatory Visit: Payer: Self-pay | Admitting: Physician Assistant

## 2016-07-29 LAB — LIPID PANEL
CHOL/HDL RATIO: 5.9 ratio — AB (ref <5.0)
CHOLESTEROL: 183 mg/dL (ref <200)
HDL: 31 mg/dL — AB (ref >50)
LDL Cholesterol: 111 mg/dL — ABNORMAL HIGH (ref <100)
Triglycerides: 203 mg/dL — ABNORMAL HIGH (ref <150)
VLDL: 41 mg/dL — AB (ref <30)

## 2016-07-29 LAB — COMPREHENSIVE METABOLIC PANEL
ALT: 31 U/L — ABNORMAL HIGH (ref 6–29)
AST: 31 U/L (ref 10–35)
Albumin: 3.7 g/dL (ref 3.6–5.1)
Alkaline Phosphatase: 72 U/L (ref 33–115)
BUN: 15 mg/dL (ref 7–25)
CHLORIDE: 101 mmol/L (ref 98–110)
CO2: 27 mmol/L (ref 20–31)
CREATININE: 1.11 mg/dL — AB (ref 0.50–1.10)
Calcium: 9.6 mg/dL (ref 8.6–10.2)
GLUCOSE: 225 mg/dL — AB (ref 65–99)
POTASSIUM: 4.1 mmol/L (ref 3.5–5.3)
SODIUM: 138 mmol/L (ref 135–146)
Total Bilirubin: 0.5 mg/dL (ref 0.2–1.2)
Total Protein: 6.3 g/dL (ref 6.1–8.1)

## 2016-07-29 LAB — HEMOGLOBIN A1C
Hgb A1c MFr Bld: 8.9 % — ABNORMAL HIGH (ref <5.7)
MEAN PLASMA GLUCOSE: 209 mg/dL

## 2016-07-29 LAB — TSH: TSH: 15.66 mIU/L — ABNORMAL HIGH

## 2016-07-30 LAB — MICROALBUMIN, URINE: MICROALB UR: 1.2 mg/dL

## 2016-09-08 ENCOUNTER — Ambulatory Visit: Payer: Self-pay | Admitting: Physician Assistant

## 2016-09-08 ENCOUNTER — Encounter: Payer: Self-pay | Admitting: Physician Assistant

## 2016-09-08 VITALS — BP 116/72 | HR 67 | Temp 97.9°F | Ht 65.5 in | Wt 291.0 lb

## 2016-09-08 DIAGNOSIS — E785 Hyperlipidemia, unspecified: Secondary | ICD-10-CM

## 2016-09-08 DIAGNOSIS — E039 Hypothyroidism, unspecified: Secondary | ICD-10-CM

## 2016-09-08 DIAGNOSIS — G8929 Other chronic pain: Secondary | ICD-10-CM

## 2016-09-08 DIAGNOSIS — E119 Type 2 diabetes mellitus without complications: Secondary | ICD-10-CM

## 2016-09-08 DIAGNOSIS — I10 Essential (primary) hypertension: Secondary | ICD-10-CM

## 2016-09-08 DIAGNOSIS — R109 Unspecified abdominal pain: Secondary | ICD-10-CM

## 2016-09-08 LAB — POCT URINALYSIS DIPSTICK
BILIRUBIN UA: NEGATIVE
Glucose, UA: NEGATIVE
KETONES UA: NEGATIVE
LEUKOCYTES UA: NEGATIVE
NITRITE UA: NEGATIVE
PROTEIN UA: NEGATIVE
RBC UA: NEGATIVE
Spec Grav, UA: 1.02
Urobilinogen, UA: 0.2
pH, UA: 5

## 2016-09-08 MED ORDER — METFORMIN HCL 1000 MG PO TABS: 1000.0000 mg | ORAL_TABLET | Freq: Two times a day (BID) | ORAL | 4 refills | Status: DC

## 2016-09-08 NOTE — Patient Instructions (Signed)
Diabetes Mellitus and Food It is important for you to manage your blood sugar (glucose) level. Your blood glucose level can be greatly affected by what you eat. Eating healthier foods in the appropriate amounts throughout the day at about the same time each day will help you control your blood glucose level. It can also help slow or prevent worsening of your diabetes mellitus. Healthy eating may even help you improve the level of your blood pressure and reach or maintain a healthy weight. General recommendations for healthful eating and cooking habits include:  Eating meals and snacks regularly. Avoid going long periods of time without eating to lose weight.  Eating a diet that consists mainly of plant-based foods, such as fruits, vegetables, nuts, legumes, and whole grains.  Using low-heat cooking methods, such as baking, instead of high-heat cooking methods, such as deep frying.  Work with your dietitian to make sure you understand how to use the Nutrition Facts information on food labels. How can food affect me? Carbohydrates Carbohydrates affect your blood glucose level more than any other type of food. Your dietitian will help you determine how many carbohydrates to eat at each meal and teach you how to count carbohydrates. Counting carbohydrates is important to keep your blood glucose at a healthy level, especially if you are using insulin or taking certain medicines for diabetes mellitus. Alcohol Alcohol can cause sudden decreases in blood glucose (hypoglycemia), especially if you use insulin or take certain medicines for diabetes mellitus. Hypoglycemia can be a life-threatening condition. Symptoms of hypoglycemia (sleepiness, dizziness, and disorientation) are similar to symptoms of having too much alcohol. If your health care provider has given you approval to drink alcohol, do so in moderation and use the following guidelines:  Women should not have more than one drink per day, and men  should not have more than two drinks per day. One drink is equal to: ? 12 oz of beer. ? 5 oz of wine. ? 1 oz of hard liquor.  Do not drink on an empty stomach.  Keep yourself hydrated. Have water, diet soda, or unsweetened iced tea.  Regular soda, juice, and other mixers might contain a lot of carbohydrates and should be counted.  What foods are not recommended? As you make food choices, it is important to remember that all foods are not the same. Some foods have fewer nutrients per serving than other foods, even though they might have the same number of calories or carbohydrates. It is difficult to get your body what it needs when you eat foods with fewer nutrients. Examples of foods that you should avoid that are high in calories and carbohydrates but low in nutrients include:  Trans fats (most processed foods list trans fats on the Nutrition Facts label).  Regular soda.  Juice.  Candy.  Sweets, such as cake, pie, doughnuts, and cookies.  Fried foods.  What foods can I eat? Eat nutrient-rich foods, which will nourish your body and keep you healthy. The food you should eat also will depend on several factors, including:  The calories you need.  The medicines you take.  Your weight.  Your blood glucose level.  Your blood pressure level.  Your cholesterol level.  You should eat a variety of foods, including:  Protein. ? Lean cuts of meat. ? Proteins low in saturated fats, such as fish, egg whites, and beans. Avoid processed meats.  Fruits and vegetables. ? Fruits and vegetables that may help control blood glucose levels, such as apples,   mangoes, and yams.  Dairy products. ? Choose fat-free or low-fat dairy products, such as milk, yogurt, and cheese.  Grains, bread, pasta, and rice. ? Choose whole grain products, such as multigrain bread, whole oats, and brown rice. These foods may help control blood pressure.  Fats. ? Foods containing healthful fats, such as  nuts, avocado, olive oil, canola oil, and fish.  Does everyone with diabetes mellitus have the same meal plan? Because every person with diabetes mellitus is different, there is not one meal plan that works for everyone. It is very important that you meet with a dietitian who will help you create a meal plan that is just right for you. This information is not intended to replace advice given to you by your health care provider. Make sure you discuss any questions you have with your health care provider. Document Released: 04/24/2005 Document Revised: 01/03/2016 Document Reviewed: 06/24/2013 [employer] Interactive Patient Education  2017 [employer]  

## 2016-09-08 NOTE — Progress Notes (Signed)
BP 116/72 (BP Location: Left Arm, Patient Position: Sitting, Cuff Size: Large)   Pulse 67   Temp 97.9 F (36.6 C)   Ht 5' 5.5" (1.664 m)   Wt 291 lb (132 kg)   SpO2 95%   BMI 47.69 kg/m    Subjective:    Patient ID: Denise Daniels, female    DOB: June 08, 1971, 46 y.o.   MRN: [id number]  HPI: Denise Daniels is a 46 y.o. female presenting on 09/08/2016 for Follow-up (pt asked about flu shot and states she will get it if recommended by PA)   HPI   Pt is feeling R flank pain.  No recent changes  Says she is still stressed.  She didn't call [facility] as recommended.   She hasn't been to [facility] for rheumatology since last summer. She says they told her she didn't need to return since she goes to pain clinic in [place].  Pt goes to [facility] (dr Mila Palmer) for her pain meds  Relevant past medical, surgical, family and social history reviewed and updated as indicated. Interim medical history since our last visit reviewed. Allergies and medications reviewed and updated.   Current Outpatient Prescriptions:  .  atenolol (TENORMIN) 25 MG tablet, Take 25 mg by mouth daily., Disp: , Rfl:  .  carisoprodol (SOMA) 350 MG tablet, Take 350 mg by mouth daily., Disp: , Rfl:  .  ibuprofen (ADVIL,MOTRIN) 200 MG tablet, Take 200 mg by mouth 2 (two) times daily as needed. , Disp: , Rfl:  .  levothyroxine (SYNTHROID, LEVOTHROID) 137 MCG tablet, Take 137 mcg by mouth daily before breakfast., Disp: , Rfl:  .  metFORMIN (GLUCOPHAGE) 500 MG tablet, Take by mouth daily before breakfast., Disp: , Rfl:  .  pantoprazole (PROTONIX) 40 MG tablet, Take 1 tablet (40 mg total) by mouth daily., Disp: 30 tablet, Rfl: 3 .  sertraline (ZOLOFT) 100 MG tablet, Take 100 mg by mouth daily., Disp: , Rfl:  .  simvastatin (ZOCOR) 20 MG tablet, Take 1 tablet (20 mg total) by mouth at bedtime., Disp: 30 tablet, Rfl: 0 .  traMADol (ULTRAM) 50 MG tablet, Take 50 mg by mouth daily as needed (Fiber Myalgia Flare). , Disp: ,  Rfl:  .  triamterene-hydrochlorothiazide (DYAZIDE) 37.5-25 MG capsule, Take 1 capsule by mouth daily., Disp: , Rfl:    Review of Systems  Constitutional: Positive for fatigue. Negative for appetite change, chills, diaphoresis, fever and unexpected weight change.  HENT: Negative for congestion, dental problem, drooling, ear pain, facial swelling, hearing loss, mouth sores, sneezing, sore throat, trouble swallowing and voice change.   Eyes: Negative for pain, discharge, redness, itching and visual disturbance.  Respiratory: Negative for cough, choking, shortness of breath and wheezing.   Cardiovascular: Positive for palpitations. Negative for chest pain and leg swelling.  Gastrointestinal: Negative for abdominal pain, blood in stool, constipation, diarrhea and vomiting.  Endocrine: Positive for heat intolerance and polydipsia. Negative for cold intolerance.  Genitourinary: Negative for decreased urine volume, dysuria and hematuria.  Musculoskeletal: Positive for arthralgias, back pain and gait problem.  Skin: Negative for rash.  Allergic/Immunologic: Negative for environmental allergies.  Neurological: Negative for seizures, syncope, light-headedness and headaches.  Hematological: Negative for adenopathy.  Psychiatric/Behavioral: Positive for agitation and dysphoric mood. Negative for suicidal ideas. The patient is not nervous/anxious.     Per HPI unless specifically indicated above     Objective:    BP 116/72 (BP Location: Left Arm, Patient Position: Sitting, Cuff Size: Large)   Pulse  67   Temp 97.9 F (36.6 C)   Ht 5' 5.5" (1.664 m)   Wt 291 lb (132 kg)   SpO2 95%   BMI 47.69 kg/m   Wt Readings from Last 3 Encounters:  09/08/16 291 lb (132 kg)  07/28/16 292 lb 3.2 oz (132.5 kg)  03/27/16 294 lb 3.2 oz (133.4 kg)    Physical Exam  Constitutional: She is oriented to person, place, and time. She appears well-developed and well-nourished.  HENT:  Head: Normocephalic and  atraumatic.  Neck: Neck supple.  Cardiovascular: Normal rate and regular rhythm.   Pulmonary/Chest: Effort normal and breath sounds normal.  Abdominal: Soft. Bowel sounds are normal. She exhibits no mass. There is no hepatosplenomegaly. There is no tenderness.  Musculoskeletal: She exhibits no edema.  Lymphadenopathy:    She has no cervical adenopathy.  Neurological: She is alert and oriented to person, place, and time.  Skin: Skin is warm and dry.  Psychiatric: She has a normal mood and affect. Her behavior is normal.  Vitals reviewed.   Results for orders placed or performed in visit on 07/28/16  TSH  Result Value Ref Range   TSH 15.66 (H) mIU/L  Lipid Profile  Result Value Ref Range   Cholesterol 183 <200 mg/dL   Triglycerides 203 (H) <150 mg/dL   HDL 31 (L) >50 mg/dL   Total CHOL/HDL Ratio 5.9 (H) <5.0 Ratio   VLDL 41 (H) <30 mg/dL   LDL Cholesterol 111 (H) <100 mg/dL  HgB A1c  Result Value Ref Range   Hgb A1c MFr Bld 8.9 (H) <5.7 %   Mean Plasma Glucose 209 mg/dL  Comprehensive Metabolic Panel (CMET)  Result Value Ref Range   Sodium 138 135 - 146 mmol/L   Potassium 4.1 3.5 - 5.3 mmol/L   Chloride 101 98 - 110 mmol/L   CO2 27 20 - 31 mmol/L   Glucose, Bld 225 (H) 65 - 99 mg/dL   BUN 15 7 - 25 mg/dL   Creat 1.11 (H) 0.50 - 1.10 mg/dL   Total Bilirubin 0.5 0.2 - 1.2 mg/dL   Alkaline Phosphatase 72 33 - 115 U/L   AST 31 10 - 35 U/L   ALT 31 (H) 6 - 29 U/L   Total Protein 6.3 6.1 - 8.1 g/dL   Albumin 3.7 3.6 - 5.1 g/dL   Calcium 9.6 8.6 - 10.2 mg/dL  Microalbumin, urine  Result Value Ref Range   Microalb, Ur 1.2 Not estab mg/dL  POCT Urinalysis Dipstick  Result Value Ref Range   Color, UA LIGHT YELLOW    Clarity, UA CLOUDY    Glucose, UA >=1,000    Bilirubin, UA NEG    Ketones, UA NEG    Spec Grav, UA 1.010    Blood, UA NEG    pH, UA 7.0    Protein, UA NEG    Urobilinogen, UA 0.2    Nitrite, UA NEG    Leukocytes, UA Trace (A) Negative  POCT Glucose (CBG)   Result Value Ref Range   POC Glucose 157 (A) 70 - 99 mg/dl      Assessment & Plan:   Encounter Diagnoses  Name Primary?  . Type 2 diabetes mellitus without complication, unspecified long term insulin use status ([place]) Yes  . Essential hypertension, benign   . Flank pain   . Hyperlipidemia, unspecified hyperlipidemia type   . Other chronic pain   . Morbid obesity, unspecified obesity type ([place])   . Hypothyroidism, unspecified type     -  reviewed labs with pt -Pt encouraged to call counseleing/MH to help with her mood -Increase metformin to 1g bid. Counseled pt to watch diabetic diet and exercise regularly -Discussed tsh- will not increase levothyroxine due to palpitations.   will monitor and recheck 3 months.  Pt is in agreement with this plan -discussed flu shot and pt says she really does not want to get one

## 2016-12-02 ENCOUNTER — Other Ambulatory Visit: Payer: Self-pay

## 2016-12-02 ENCOUNTER — Other Ambulatory Visit: Payer: Self-pay | Admitting: Physician Assistant

## 2016-12-02 DIAGNOSIS — E119 Type 2 diabetes mellitus without complications: Secondary | ICD-10-CM

## 2016-12-02 DIAGNOSIS — E039 Hypothyroidism, unspecified: Secondary | ICD-10-CM

## 2016-12-02 DIAGNOSIS — E785 Hyperlipidemia, unspecified: Secondary | ICD-10-CM

## 2016-12-02 DIAGNOSIS — I10 Essential (primary) hypertension: Secondary | ICD-10-CM

## 2016-12-03 LAB — COMPREHENSIVE METABOLIC PANEL
ALBUMIN: 3.7 g/dL (ref 3.6–5.1)
ALT: 41 U/L — ABNORMAL HIGH (ref 6–29)
AST: 36 U/L — AB (ref 10–35)
Alkaline Phosphatase: 123 U/L — ABNORMAL HIGH (ref 33–115)
BILIRUBIN TOTAL: 0.5 mg/dL (ref 0.2–1.2)
BUN: 12 mg/dL (ref 7–25)
CALCIUM: 9.8 mg/dL (ref 8.6–10.2)
CO2: 30 mmol/L (ref 20–31)
Chloride: 98 mmol/L (ref 98–110)
Creat: 0.89 mg/dL (ref 0.50–1.10)
Glucose, Bld: 308 mg/dL — ABNORMAL HIGH (ref 65–99)
POTASSIUM: 4.6 mmol/L (ref 3.5–5.3)
Sodium: 136 mmol/L (ref 135–146)
Total Protein: 6.5 g/dL (ref 6.1–8.1)

## 2016-12-03 LAB — LIPID PANEL
CHOL/HDL RATIO: 6.2 ratio — AB (ref <5.0)
CHOLESTEROL: 198 mg/dL (ref <200)
HDL: 32 mg/dL — AB (ref >50)
LDL Cholesterol: 126 mg/dL — ABNORMAL HIGH (ref <100)
TRIGLYCERIDES: 199 mg/dL — AB (ref <150)
VLDL: 40 mg/dL — ABNORMAL HIGH (ref <30)

## 2016-12-03 LAB — TSH: TSH: 8.91 m[IU]/L — AB

## 2016-12-04 LAB — HEMOGLOBIN A1C
Hgb A1c MFr Bld: 10.3 % — ABNORMAL HIGH (ref <5.7)
Mean Plasma Glucose: 249 mg/dL

## 2016-12-08 ENCOUNTER — Ambulatory Visit: Payer: Self-pay | Admitting: Physician Assistant

## 2016-12-08 ENCOUNTER — Encounter: Payer: Self-pay | Admitting: Physician Assistant

## 2016-12-08 VITALS — BP 120/74 | HR 73 | Ht 65.5 in | Wt 289.8 lb

## 2016-12-08 DIAGNOSIS — E785 Hyperlipidemia, unspecified: Secondary | ICD-10-CM

## 2016-12-08 DIAGNOSIS — I10 Essential (primary) hypertension: Secondary | ICD-10-CM

## 2016-12-08 DIAGNOSIS — Z91199 Patient's noncompliance with other medical treatment and regimen due to unspecified reason: Secondary | ICD-10-CM

## 2016-12-08 DIAGNOSIS — R748 Abnormal levels of other serum enzymes: Secondary | ICD-10-CM

## 2016-12-08 DIAGNOSIS — G8929 Other chronic pain: Secondary | ICD-10-CM

## 2016-12-08 DIAGNOSIS — K219 Gastro-esophageal reflux disease without esophagitis: Secondary | ICD-10-CM

## 2016-12-08 DIAGNOSIS — E1165 Type 2 diabetes mellitus with hyperglycemia: Secondary | ICD-10-CM

## 2016-12-08 DIAGNOSIS — E118 Type 2 diabetes mellitus with unspecified complications: Principal | ICD-10-CM

## 2016-12-08 DIAGNOSIS — E039 Hypothyroidism, unspecified: Secondary | ICD-10-CM

## 2016-12-08 DIAGNOSIS — Z9119 Patient's noncompliance with other medical treatment and regimen: Secondary | ICD-10-CM

## 2016-12-08 MED ORDER — LEVOTHYROXINE SODIUM 137 MCG PO TABS: 137.0000 ug | ORAL_TABLET | Freq: Every day | ORAL | 3 refills | Status: DC

## 2016-12-08 MED ORDER — ATENOLOL 25 MG PO TABS: 25.0000 mg | ORAL_TABLET | Freq: Every day | ORAL | 2 refills | Status: AC

## 2016-12-08 MED ORDER — GLIPIZIDE 10 MG PO TABS: 10.0000 mg | ORAL_TABLET | Freq: Two times a day (BID) | ORAL | 3 refills | Status: DC

## 2016-12-08 NOTE — Progress Notes (Signed)
BP 120/74 (BP Location: Left Arm, Patient Position: Sitting, Cuff Size: Large)   Pulse 73   Ht 5' 5.5" (1.664 m)   Wt 289 lb 12 oz (131.4 kg)   SpO2 97%   BMI 47.48 kg/m    Subjective:    Patient ID: Denise Daniels, female    DOB: 1971/04/30, 46 y.o.   MRN: [id number]  HPI: Denise Daniels is a 46 y.o. female presenting on 12/08/2016 for Diabetes and Thyroid Problem   HPI  Pt did not contact [facility] as recommended.  She is still stressed with mother and husband and brother-in-law all having medical problems.  Pt stopped her simvastatin because she thought is made her breathing bad.  She stopped it about 2 months ago.   Pt has still not attended diabetic education class yet.  Relevant past medical, surgical, family and social history reviewed and updated as indicated. Interim medical history since our last visit reviewed. Allergies and medications reviewed and updated.   Current Outpatient Prescriptions:  .  atenolol (TENORMIN) 25 MG tablet, Take 25 mg by mouth daily., Disp: , Rfl:  .  carisoprodol (SOMA) 350 MG tablet, Take 350 mg by mouth daily., Disp: , Rfl:  .  ibuprofen (ADVIL,MOTRIN) 200 MG tablet, Take 200 mg by mouth 2 (two) times daily as needed. , Disp: , Rfl:  .  levothyroxine (SYNTHROID, LEVOTHROID) 137 MCG tablet, Take 137 mcg by mouth daily before breakfast., Disp: , Rfl:  .  metFORMIN (GLUCOPHAGE) 1000 MG tablet, Take 1 tablet (1,000 mg total) by mouth 2 (two) times daily with a meal., Disp: 60 tablet, Rfl: 4 .  pantoprazole (PROTONIX) 40 MG tablet, TAKE ONE TABLET BY MOUTH ONCE DAILY, Disp: 30 tablet, Rfl: 1 .  sertraline (ZOLOFT) 100 MG tablet, Take 100 mg by mouth daily., Disp: , Rfl:  .  traMADol (ULTRAM) 50 MG tablet, Take 50 mg by mouth every 8 (eight) hours as needed for moderate pain (Fiber Myalgia Flare). , Disp: , Rfl:  .  triamterene-hydrochlorothiazide (DYAZIDE) 37.5-25 MG capsule, Take 1 capsule by mouth daily., Disp: , Rfl:  .  simvastatin (ZOCOR) 20 MG tablet,  Take 1 tablet (20 mg total) by mouth at bedtime. (Patient not taking: Reported on 12/08/2016), Disp: 30 tablet, Rfl: 0   Review of Systems  Constitutional: Positive for fatigue. Negative for appetite change, chills, diaphoresis, fever and unexpected weight change.  HENT: Negative for congestion, drooling, ear pain, facial swelling, hearing loss, mouth sores, sneezing, sore throat, trouble swallowing and voice change.   Eyes: Negative for pain, discharge, redness, itching and visual disturbance.  Respiratory: Positive for shortness of breath. Negative for cough, choking and wheezing.   Cardiovascular: Positive for palpitations and leg swelling. Negative for chest pain.  Gastrointestinal: Negative for abdominal pain, blood in stool, constipation, diarrhea and vomiting.  Endocrine: Positive for heat intolerance and polydipsia. Negative for cold intolerance.  Genitourinary: Negative for decreased urine volume, dysuria and hematuria.  Musculoskeletal: Positive for arthralgias and back pain. Negative for gait problem.  Skin: Negative for rash.  Allergic/Immunologic: Negative for environmental allergies.  Neurological: Negative for seizures, syncope, light-headedness and headaches.  Hematological: Negative for adenopathy.  Psychiatric/Behavioral: Positive for agitation and dysphoric mood. Negative for suicidal ideas. The patient is nervous/anxious.     Per HPI unless specifically indicated above     Objective:    BP 120/74 (BP Location: Left Arm, Patient Position: Sitting, Cuff Size: Large)   Pulse 73   Ht 5' 5.5" (1.664  m)   Wt 289 lb 12 oz (131.4 kg)   SpO2 97%   BMI 47.48 kg/m   Wt Readings from Last 3 Encounters:  12/08/16 289 lb 12 oz (131.4 kg)  09/08/16 291 lb (132 kg)  07/28/16 292 lb 3.2 oz (132.5 kg)    Physical Exam  Constitutional: She is oriented to person, place, and time. She appears well-developed and well-nourished.  HENT:  Head: Normocephalic and atraumatic.  Neck:  Neck supple.  Cardiovascular: Normal rate and regular rhythm.   Pulmonary/Chest: Effort normal and breath sounds normal.  Abdominal: Soft. Bowel sounds are normal. She exhibits no mass. There is no hepatosplenomegaly. There is no tenderness.  Musculoskeletal: She exhibits no edema.  Lymphadenopathy:    She has no cervical adenopathy.  Neurological: She is alert and oriented to person, place, and time.  Skin: Skin is warm and dry.  Psychiatric: She has a normal mood and affect. Her behavior is normal.  Vitals reviewed.   Results for orders placed or performed in visit on 12/02/16  Hemoglobin A1c  Result Value Ref Range   Hgb A1c MFr Bld 10.3 (H) <5.7 %   Mean Plasma Glucose 249 mg/dL  TSH  Result Value Ref Range   TSH 8.91 (H) mIU/L  Lipid panel  Result Value Ref Range   Cholesterol 198 <200 mg/dL   Triglycerides 199 (H) <150 mg/dL   HDL 32 (L) >50 mg/dL   Total CHOL/HDL Ratio 6.2 (H) <5.0 Ratio   VLDL 40 (H) <30 mg/dL   LDL Cholesterol 126 (H) <100 mg/dL  Comprehensive metabolic panel  Result Value Ref Range   Sodium 136 135 - 146 mmol/L   Potassium 4.6 3.5 - 5.3 mmol/L   Chloride 98 98 - 110 mmol/L   CO2 30 20 - 31 mmol/L   Glucose, Bld 308 (H) 65 - 99 mg/dL   BUN 12 7 - 25 mg/dL   Creat 0.89 0.50 - 1.10 mg/dL   Total Bilirubin 0.5 0.2 - 1.2 mg/dL   Alkaline Phosphatase 123 (H) 33 - 115 U/L   AST 36 (H) 10 - 35 U/L   ALT 41 (H) 6 - 29 U/L   Total Protein 6.5 6.1 - 8.1 g/dL   Albumin 3.7 3.6 - 5.1 g/dL   Calcium 9.8 8.6 - 10.2 mg/dL      Assessment & Plan:   Encounter Diagnoses  Name Primary?  Marland Kitchen Uncontrolled type 2 diabetes mellitus with complication, unspecified whether long term insulin use ([place]) Yes  . Essential hypertension, benign   . Hyperlipidemia, unspecified hyperlipidemia type   . Elevated liver enzymes   . Hypothyroidism, unspecified type   . Other chronic pain   . Morbid obesity, unspecified obesity type ([place])   . Personal history of noncompliance  with medical treatment, presenting hazards to health   . Gastroesophageal reflux disease, esophagitis presence not specified     -reviewed labs with pt -Pt declined medassist assistance -Add glipizide for diabetes.   -pt to Go to diabetic class at [facility] -pt to follow up in 3 months.  RTO sooner prn

## 2017-01-27 ENCOUNTER — Other Ambulatory Visit: Payer: Self-pay | Admitting: Physician Assistant

## 2017-01-27 DIAGNOSIS — E039 Hypothyroidism, unspecified: Secondary | ICD-10-CM

## 2017-01-27 DIAGNOSIS — E785 Hyperlipidemia, unspecified: Secondary | ICD-10-CM

## 2017-01-27 DIAGNOSIS — I10 Essential (primary) hypertension: Secondary | ICD-10-CM

## 2017-01-27 DIAGNOSIS — E1165 Type 2 diabetes mellitus with hyperglycemia: Secondary | ICD-10-CM

## 2017-01-27 DIAGNOSIS — E118 Type 2 diabetes mellitus with unspecified complications: Principal | ICD-10-CM

## 2017-01-27 DIAGNOSIS — R748 Abnormal levels of other serum enzymes: Secondary | ICD-10-CM

## 2017-03-09 ENCOUNTER — Ambulatory Visit: Payer: Self-pay | Admitting: Physician Assistant

## 2017-03-10 ENCOUNTER — Other Ambulatory Visit (HOSPITAL_COMMUNITY)
Admission: RE | Admit: 2017-03-10 | Discharge: 2017-03-10 | Disposition: A | Discharge status: Home / Self Care | Payer: No Typology Code available for payment source | Source: Ambulatory Visit | Attending: Physician Assistant | Admitting: Physician Assistant

## 2017-03-10 DIAGNOSIS — I10 Essential (primary) hypertension: Secondary | ICD-10-CM | POA: Insufficient documentation

## 2017-03-10 DIAGNOSIS — R748 Abnormal levels of other serum enzymes: Secondary | ICD-10-CM | POA: Insufficient documentation

## 2017-03-10 DIAGNOSIS — E039 Hypothyroidism, unspecified: Secondary | ICD-10-CM

## 2017-03-10 DIAGNOSIS — E1165 Type 2 diabetes mellitus with hyperglycemia: Secondary | ICD-10-CM | POA: Insufficient documentation

## 2017-03-10 DIAGNOSIS — E785 Hyperlipidemia, unspecified: Secondary | ICD-10-CM

## 2017-03-10 DIAGNOSIS — E118 Type 2 diabetes mellitus with unspecified complications: Secondary | ICD-10-CM | POA: Insufficient documentation

## 2017-03-10 LAB — COMPREHENSIVE METABOLIC PANEL
ALK PHOS: 108 U/L (ref 38–126)
ALT: 40 U/L (ref 14–54)
ANION GAP: 10 (ref 5–15)
AST: 40 U/L (ref 15–41)
Albumin: 3.5 g/dL (ref 3.5–5.0)
BILIRUBIN TOTAL: 0.5 mg/dL (ref 0.3–1.2)
BUN: 14 mg/dL (ref 6–20)
CALCIUM: 9.6 mg/dL (ref 8.9–10.3)
CO2: 27 mmol/L (ref 22–32)
Chloride: 101 mmol/L (ref 101–111)
Creatinine, Ser: 0.86 mg/dL (ref 0.44–1.00)
GFR calc Af Amer: >60 mL/min (ref >60)
GFR calc non Af Amer: >60 mL/min (ref >60)
Glucose, Bld: 278 mg/dL — ABNORMAL HIGH (ref 65–99)
Potassium: 4.1 mmol/L (ref 3.5–5.1)
Sodium: 138 mmol/L (ref 135–145)
Total Protein: 6.9 g/dL (ref 6.5–8.1)

## 2017-03-10 LAB — LIPID PANEL
CHOL/HDL RATIO: 5.4 ratio
CHOLESTEROL: 209 mg/dL — AB (ref 0–200)
HDL: 39 mg/dL — AB (ref >40)
LDL Cholesterol: 127 mg/dL — ABNORMAL HIGH (ref 0–99)
Triglycerides: 214 mg/dL — ABNORMAL HIGH (ref <150)
VLDL: 43 mg/dL — ABNORMAL HIGH (ref 0–40)

## 2017-03-10 LAB — TSH: TSH: 34.081 u[IU]/mL — AB (ref 0.350–4.500)

## 2017-03-11 LAB — HEMOGLOBIN A1C
Hgb A1c MFr Bld: 10.2 % — ABNORMAL HIGH (ref 4.8–5.6)
Mean Plasma Glucose: 246 mg/dL

## 2017-03-16 ENCOUNTER — Encounter: Payer: Self-pay | Admitting: Physician Assistant

## 2017-03-16 ENCOUNTER — Ambulatory Visit: Payer: Self-pay | Admitting: Physician Assistant

## 2017-03-16 VITALS — BP 134/80 | HR 73 | Temp 98.6°F | Ht 65.5 in | Wt 282.2 lb

## 2017-03-16 DIAGNOSIS — Z91199 Patient's noncompliance with other medical treatment and regimen due to unspecified reason: Secondary | ICD-10-CM

## 2017-03-16 DIAGNOSIS — E118 Type 2 diabetes mellitus with unspecified complications: Principal | ICD-10-CM

## 2017-03-16 DIAGNOSIS — E039 Hypothyroidism, unspecified: Secondary | ICD-10-CM

## 2017-03-16 DIAGNOSIS — Z9119 Patient's noncompliance with other medical treatment and regimen: Secondary | ICD-10-CM

## 2017-03-16 DIAGNOSIS — E785 Hyperlipidemia, unspecified: Secondary | ICD-10-CM

## 2017-03-16 DIAGNOSIS — E1165 Type 2 diabetes mellitus with hyperglycemia: Secondary | ICD-10-CM

## 2017-03-16 DIAGNOSIS — I10 Essential (primary) hypertension: Secondary | ICD-10-CM

## 2017-03-16 DIAGNOSIS — G8929 Other chronic pain: Secondary | ICD-10-CM

## 2017-03-16 MED ORDER — LEVOTHYROXINE SODIUM 150 MCG PO TABS: 150.0000 ug | ORAL_TABLET | Freq: Every day | ORAL | 3 refills | Status: DC

## 2017-03-16 NOTE — Progress Notes (Signed)
BP 134/80 (BP Location: Left Arm, Patient Position: Sitting, Cuff Size: Large)   Pulse 73   Temp 98.6 F (37 C)   Ht 5' 5.5" (1.664 m)   Wt 282 lb 4 oz (128 kg)   SpO2 95%   BMI 46.25 kg/m    Subjective:    Patient ID: Denise Daniels, female    DOB: Feb 13, 1971, 46 y.o.   MRN: [id number]  HPI: Denise Daniels is a 46 y.o. female presenting on 03/16/2017 for Diabetes   HPI  Discussed treatment of all condistions by dr Darlys Gales in [place].  She insists that she only goes there for pain medications.    Pt did not go to DM eduaction class  Pt says she hasn't done anything because her mother is getting ready to die.   Pt didn't take the glipizide that we added 3 months ago because it made her sick.   Pt only takes 500 of metformin because the 1000 made her sick.  Pt stopped her simvastatin because she says it made her feels like she was dying of a heart attack.   She had undesirable side effect on lovastatin in the past   Pt says she keeeps the amoxil "on hand" for when she gets a rash.  She is not actively taking it now.  She says she got it from dr Sharol Given in [facility]  Relevant past medical, surgical, family and social history reviewed and updated as indicated. Interim medical history since our last visit reviewed. Allergies and medications reviewed and updated.   Current Outpatient Prescriptions:  .  amoxicillin (AMOXIL) 250 MG capsule, Take 250 mg by mouth 2 (two) times daily as needed (for skin)., Disp: , Rfl:  .  atenolol (TENORMIN) 25 MG tablet, Take 1 tablet (25 mg total) by mouth daily., Disp: 30 tablet, Rfl: 2 .  carisoprodol (SOMA) 350 MG tablet, Take 350 mg by mouth daily., Disp: , Rfl:  .  gabapentin (NEURONTIN) 300 MG capsule, Take 300 mg by mouth at bedtime., Disp: , Rfl:  .  ibuprofen (ADVIL,MOTRIN) 200 MG tablet, Take 200 mg by mouth 2 (two) times daily as needed. , Disp: , Rfl:  .  levothyroxine (SYNTHROID, LEVOTHROID) 137 MCG tablet, Take 1 tablet (137 mcg  total) by mouth daily before breakfast., Disp: 30 tablet, Rfl: 3 .  metFORMIN (GLUCOPHAGE) 1000 MG tablet, Take 1 tablet (1,000 mg total) by mouth 2 (two) times daily with a meal. (Patient taking differently: Take 500 mg by mouth 2 (two) times daily with a meal. ), Disp: 60 tablet, Rfl: 4 .  sertraline (ZOLOFT) 100 MG tablet, Take 100 mg by mouth daily., Disp: , Rfl:  .  traMADol (ULTRAM) 50 MG tablet, Take 50 mg by mouth every 8 (eight) hours as needed for moderate pain (Fiber Myalgia Flare). , Disp: , Rfl:  .  triamterene-hydrochlorothiazide (DYAZIDE) 37.5-25 MG capsule, Take 1 capsule by mouth daily., Disp: , Rfl:  .  glipiZIDE (GLUCOTROL) 10 MG tablet, Take 1 tablet (10 mg total) by mouth 2 (two) times daily before a meal. (Patient not taking: Reported on 03/16/2017), Disp: 60 tablet, Rfl: 3 .  pantoprazole (PROTONIX) 40 MG tablet, TAKE ONE TABLET BY MOUTH ONCE DAILY (Patient not taking: Reported on 03/16/2017), Disp: 30 tablet, Rfl: 1 .  simvastatin (ZOCOR) 20 MG tablet, Take 1 tablet (20 mg total) by mouth at bedtime. (Patient not taking: Reported on 12/08/2016), Disp: 30 tablet, Rfl: 0   Review of Systems  Per HPI  unless specifically indicated above     Objective:    BP 134/80 (BP Location: Left Arm, Patient Position: Sitting, Cuff Size: Large)   Pulse 73   Temp 98.6 F (37 C)   Ht 5' 5.5" (1.664 m)   Wt 282 lb 4 oz (128 kg)   SpO2 95%   BMI 46.25 kg/m   Wt Readings from Last 3 Encounters:  03/16/17 282 lb 4 oz (128 kg)  12/08/16 289 lb 12 oz (131.4 kg)  09/08/16 291 lb (132 kg)    Physical Exam  Constitutional: She is oriented to person, place, and time. She appears well-developed and well-nourished.  HENT:  Head: Normocephalic and atraumatic.  Neck: Neck supple.  Cardiovascular: Normal rate and regular rhythm.   Pulmonary/Chest: Effort normal and breath sounds normal.  Abdominal: Soft. Bowel sounds are normal. She exhibits no mass. There is no hepatosplenomegaly. There is no  tenderness.  Musculoskeletal: She exhibits no edema.  Lymphadenopathy:    She has no cervical adenopathy.  Neurological: She is alert and oriented to person, place, and time.  Skin: Skin is warm and dry.  Psychiatric: She has a normal mood and affect. Her behavior is normal.  Vitals reviewed.   Results for orders placed or performed during the hospital encounter of 03/10/17  TSH  Result Value Ref Range   TSH 34.081 (H) 0.350 - 4.500 uIU/mL  Lipid panel  Result Value Ref Range   Cholesterol 209 (H) 0 - 200 mg/dL   Triglycerides 214 (H) <150 mg/dL   HDL 39 (L) >40 mg/dL   Total CHOL/HDL Ratio 5.4 RATIO   VLDL 43 (H) 0 - 40 mg/dL   LDL Cholesterol 127 (H) 0 - 99 mg/dL  Comprehensive metabolic panel  Result Value Ref Range   Sodium 138 135 - 145 mmol/L   Potassium 4.1 3.5 - 5.1 mmol/L   Chloride 101 101 - 111 mmol/L   CO2 27 22 - 32 mmol/L   Glucose, Bld 278 (H) 65 - 99 mg/dL   BUN 14 6 - 20 mg/dL   Creatinine, Ser 0.86 0.44 - 1.00 mg/dL   Calcium 9.6 8.9 - 10.3 mg/dL   Total Protein 6.9 6.5 - 8.1 g/dL   Albumin 3.5 3.5 - 5.0 g/dL   AST 40 15 - 41 U/L   ALT 40 14 - 54 U/L   Alkaline Phosphatase 108 38 - 126 U/L   Total Bilirubin 0.5 0.3 - 1.2 mg/dL   GFR calc non Af Amer >60 >60 mL/min   GFR calc Af Amer >60 >60 mL/min   Anion gap 10 5 - 15  Hemoglobin A1c  Result Value Ref Range   Hgb A1c MFr Bld 10.2 (H) 4.8 - 5.6 %   Mean Plasma Glucose 246 mg/dL      Assessment & Plan:   Encounter Diagnoses  Name Primary?  Marland Kitchen Uncontrolled type 2 diabetes mellitus with complication, unspecified whether long term insulin use ([place]) Yes  . Hypothyroidism, unspecified type   . Essential hypertension, benign   . Hyperlipidemia, unspecified hyperlipidemia type   . Other chronic pain   . Morbid obesity, unspecified obesity type ([place])   . Personal history of noncompliance with medical treatment, presenting hazards to health     -reviewed labs with pt -discussed again with pt the  dangers of getting primary care and management of all her medical conditions from multiple providers.  She insists she is only getting pain management from Dr Darlys Gales in [place] despite office  note (reviewed in care everywhere) states otherwise -pt levothyroxine increased -pt is started on lantus for her uncontrolled diabetes.  She is taught how to use it and how to use bs meter.  She will start on lantus 10u qhs.  She is to check her bs qam and other times as she feels it is needed.  She is to notify office for fbs > 300 or < 70. -pt is reminded to attend diabetes education class -pt is to follow up with bs log in one month.  RTO sooner prn  (The duration of this appointment visit was 25 minutes of face-to-face time with the patient.  Greater than 50% of this time was spent in counseling, explanation of diagnosis, planning of further management, and coordination of care.)

## 2017-04-20 ENCOUNTER — Encounter: Payer: Self-pay | Admitting: Physician Assistant

## 2017-04-20 ENCOUNTER — Ambulatory Visit: Payer: Self-pay | Admitting: Physician Assistant

## 2017-04-20 VITALS — BP 130/70 | HR 66 | Temp 97.7°F | Ht 65.5 in | Wt 277.0 lb

## 2017-04-20 DIAGNOSIS — G8929 Other chronic pain: Secondary | ICD-10-CM

## 2017-04-20 DIAGNOSIS — E785 Hyperlipidemia, unspecified: Secondary | ICD-10-CM

## 2017-04-20 DIAGNOSIS — E039 Hypothyroidism, unspecified: Secondary | ICD-10-CM

## 2017-04-20 DIAGNOSIS — I10 Essential (primary) hypertension: Secondary | ICD-10-CM

## 2017-04-20 DIAGNOSIS — E118 Type 2 diabetes mellitus with unspecified complications: Principal | ICD-10-CM

## 2017-04-20 DIAGNOSIS — E1165 Type 2 diabetes mellitus with hyperglycemia: Secondary | ICD-10-CM

## 2017-04-20 NOTE — Progress Notes (Signed)
BP 130/70 (BP Location: Left Arm, Patient Position: Sitting, Cuff Size: Normal)   Pulse 66   Temp 97.7 F (36.5 C)   Ht 5' 5.5" (1.664 m)   Wt 277 lb (125.6 kg)   SpO2 98%   BMI 45.39 kg/m    Subjective:    Patient ID: Denise Daniels, female    DOB: June 01, 1971, 46 y.o.   MRN: [id number]  HPI: Denise Daniels is a 46 y.o. female presenting on 04/20/2017 for Diabetes   HPI   Pt started lantus at lantus at last OV.   She is only taking her metformin qd.  Pt not taking statins- says she has reactions to all including some make her irritable.  She was tried on simvastatin and lovastatin.   Relevant past medical, surgical, family and social history reviewed and updated as indicated. Interim medical history since our last visit reviewed. Allergies and medications reviewed and updated.   Current Outpatient Prescriptions:  .  amoxicillin (AMOXIL) 250 MG capsule, Take 250 mg by mouth 2 (two) times daily as needed (for skin)., Disp: , Rfl:  .  atenolol (TENORMIN) 25 MG tablet, Take 1 tablet (25 mg total) by mouth daily., Disp: 30 tablet, Rfl: 2 .  carisoprodol (SOMA) 350 MG tablet, Take 350 mg by mouth daily., Disp: , Rfl:  .  gabapentin (NEURONTIN) 300 MG capsule, Take 300 mg by mouth at bedtime., Disp: , Rfl:  .  ibuprofen (ADVIL,MOTRIN) 200 MG tablet, Take 200 mg by mouth 2 (two) times daily as needed. , Disp: , Rfl:  .  Insulin Glargine (LANTUS) 100 UNIT/ML Solostar Pen, Inject 10 Units into the skin at bedtime., Disp: , Rfl:  .  levothyroxine (SYNTHROID, LEVOTHROID) 150 MCG tablet, Take 1 tablet (150 mcg total) by mouth daily., Disp: 30 tablet, Rfl: 3 .  metFORMIN (GLUCOPHAGE) 1000 MG tablet, Take 1 tablet (1,000 mg total) by mouth 2 (two) times daily with a meal. (Patient taking differently: Take 500 mg by mouth daily. ), Disp: 60 tablet, Rfl: 4 .  ranitidine (ZANTAC) 150 MG tablet, Take 150 mg by mouth at bedtime., Disp: , Rfl:  .  sertraline (ZOLOFT) 100 MG tablet, Take 100 mg by mouth  daily., Disp: , Rfl:  .  traMADol (ULTRAM) 50 MG tablet, Take 50 mg by mouth every 8 (eight) hours as needed for moderate pain (Fiber Myalgia Flare). , Disp: , Rfl:  .  triamterene-hydrochlorothiazide (DYAZIDE) 37.5-25 MG capsule, Take 1 capsule by mouth daily., Disp: , Rfl:   Review of Systems  Constitutional: Negative for appetite change, chills, diaphoresis, fatigue, fever and unexpected weight change.  HENT: Negative for congestion, dental problem, drooling, ear pain, facial swelling, hearing loss, mouth sores, sneezing, sore throat, trouble swallowing and voice change.   Eyes: Negative for pain, discharge, redness, itching and visual disturbance.  Respiratory: Negative for cough, choking, shortness of breath and wheezing.   Cardiovascular: Negative for chest pain, palpitations and leg swelling.  Gastrointestinal: Negative for abdominal pain, blood in stool, constipation, diarrhea and vomiting.  Endocrine: Negative for cold intolerance, heat intolerance and polydipsia.  Genitourinary: Negative for decreased urine volume, dysuria and hematuria.  Musculoskeletal: Negative for arthralgias, back pain and gait problem.  Skin: Negative for rash.  Allergic/Immunologic: Negative for environmental allergies.  Neurological: Negative for seizures, syncope, light-headedness and headaches.  Hematological: Negative for adenopathy.  Psychiatric/Behavioral: Negative for agitation, dysphoric mood and suicidal ideas. The patient is not nervous/anxious.     Per HPI unless specifically indicated  above     Objective:    BP 130/70 (BP Location: Left Arm, Patient Position: Sitting, Cuff Size: Normal)   Pulse 66   Temp 97.7 F (36.5 C)   Ht 5' 5.5" (1.664 m)   Wt 277 lb (125.6 kg)   SpO2 98%   BMI 45.39 kg/m   Wt Readings from Last 3 Encounters:  04/20/17 277 lb (125.6 kg)  03/16/17 282 lb 4 oz (128 kg)  12/08/16 289 lb 12 oz (131.4 kg)    Physical Exam  Results for orders placed or performed  during the hospital encounter of 03/10/17  TSH  Result Value Ref Range   TSH 34.081 (H) 0.350 - 4.500 uIU/mL  Lipid panel  Result Value Ref Range   Cholesterol 209 (H) 0 - 200 mg/dL   Triglycerides 214 (H) <150 mg/dL   HDL 39 (L) >40 mg/dL   Total CHOL/HDL Ratio 5.4 RATIO   VLDL 43 (H) 0 - 40 mg/dL   LDL Cholesterol 127 (H) 0 - 99 mg/dL  Comprehensive metabolic panel  Result Value Ref Range   Sodium 138 135 - 145 mmol/L   Potassium 4.1 3.5 - 5.1 mmol/L   Chloride 101 101 - 111 mmol/L   CO2 27 22 - 32 mmol/L   Glucose, Bld 278 (H) 65 - 99 mg/dL   BUN 14 6 - 20 mg/dL   Creatinine, Ser 0.86 0.44 - 1.00 mg/dL   Calcium 9.6 8.9 - 10.3 mg/dL   Total Protein 6.9 6.5 - 8.1 g/dL   Albumin 3.5 3.5 - 5.0 g/dL   AST 40 15 - 41 U/L   ALT 40 14 - 54 U/L   Alkaline Phosphatase 108 38 - 126 U/L   Total Bilirubin 0.5 0.3 - 1.2 mg/dL   GFR calc non Af Amer >60 >60 mL/min   GFR calc Af Amer >60 >60 mL/min   Anion gap 10 5 - 15  Hemoglobin A1c  Result Value Ref Range   Hgb A1c MFr Bld 10.2 (H) 4.8 - 5.6 %   Mean Plasma Glucose 246 mg/dL      Assessment & Plan:   Encounter Diagnoses  Name Primary?  Marland Kitchen Uncontrolled type 2 diabetes mellitus with complication, unspecified whether long term insulin use ([place]) Yes  . Hypothyroidism, unspecified type   . Essential hypertension, benign   . Hyperlipidemia, unspecified hyperlipidemia type   . Other chronic pain   . Morbid obesity, unspecified obesity type ([place])     -pt counseled to Take the metformin bid as per label.  Continue lantus 10units.  -pt again counseled to Go to DM education class -will try pt on welchol for lipids since she has been intolerant of statins.  Pt assistance form completed -pt to follow up 2 months with recheck a1c before OV (will not check lipids becase not on welchol yet)

## 2017-05-11 ENCOUNTER — Ambulatory Visit: Payer: Self-pay | Admitting: Physician Assistant

## 2017-05-20 ENCOUNTER — Other Ambulatory Visit: Payer: Self-pay | Admitting: Family Medicine

## 2017-05-20 DIAGNOSIS — Z1231 Encounter for screening mammogram for malignant neoplasm of breast: Secondary | ICD-10-CM

## 2017-05-25 ENCOUNTER — Ambulatory Visit
Admission: RE | Admit: 2017-05-25 | Discharge: 2017-05-25 | Disposition: A | Discharge status: Home / Self Care | Payer: No Typology Code available for payment source | Source: Ambulatory Visit | Attending: Family Medicine | Admitting: Family Medicine

## 2017-05-25 DIAGNOSIS — Z1231 Encounter for screening mammogram for malignant neoplasm of breast: Secondary | ICD-10-CM

## 2017-06-15 ENCOUNTER — Other Ambulatory Visit (HOSPITAL_COMMUNITY)
Admission: RE | Admit: 2017-06-15 | Discharge: 2017-06-15 | Disposition: A | Discharge status: Home / Self Care | Payer: No Typology Code available for payment source | Source: Ambulatory Visit | Attending: Physician Assistant | Admitting: Physician Assistant

## 2017-06-15 DIAGNOSIS — E039 Hypothyroidism, unspecified: Secondary | ICD-10-CM | POA: Insufficient documentation

## 2017-06-15 DIAGNOSIS — IMO0002 Reserved for concepts with insufficient information to code with codable children: Secondary | ICD-10-CM

## 2017-06-15 DIAGNOSIS — E1165 Type 2 diabetes mellitus with hyperglycemia: Secondary | ICD-10-CM | POA: Insufficient documentation

## 2017-06-15 DIAGNOSIS — E118 Type 2 diabetes mellitus with unspecified complications: Secondary | ICD-10-CM | POA: Insufficient documentation

## 2017-06-15 DIAGNOSIS — I10 Essential (primary) hypertension: Secondary | ICD-10-CM | POA: Insufficient documentation

## 2017-06-15 LAB — BASIC METABOLIC PANEL
BUN: 17 mg/dL (ref 6–20)
CALCIUM: 11.2 mg/dL — AB (ref 8.9–10.3)
CO2: 25 mmol/L (ref 22–32)
CREATININE: 0.89 mg/dL (ref 0.44–1.00)
Chloride: 95 mmol/L — ABNORMAL LOW (ref 101–111)
GFR calc Af Amer: >60 mL/min (ref >60)
Glucose, Bld: 126 mg/dL — ABNORMAL HIGH (ref 65–99)
Potassium: 3.9 mmol/L (ref 3.5–5.1)
SODIUM: 141 mmol/L (ref 135–145)

## 2017-06-15 LAB — HEMOGLOBIN A1C
HEMOGLOBIN A1C: 6.8 % — AB (ref 4.8–5.6)
Mean Plasma Glucose: 148.46 mg/dL

## 2017-06-15 LAB — TSH: TSH: 2.58 u[IU]/mL (ref 0.350–4.500)

## 2017-06-22 ENCOUNTER — Ambulatory Visit: Payer: Self-pay | Admitting: Physician Assistant

## 2017-06-22 ENCOUNTER — Encounter: Payer: Self-pay | Admitting: Physician Assistant

## 2017-06-22 VITALS — BP 130/84 | HR 74 | Temp 97.5°F | Ht 65.5 in | Wt 272.0 lb

## 2017-06-22 DIAGNOSIS — E785 Hyperlipidemia, unspecified: Secondary | ICD-10-CM

## 2017-06-22 DIAGNOSIS — K219 Gastro-esophageal reflux disease without esophagitis: Secondary | ICD-10-CM

## 2017-06-22 DIAGNOSIS — E039 Hypothyroidism, unspecified: Secondary | ICD-10-CM

## 2017-06-22 DIAGNOSIS — G8929 Other chronic pain: Secondary | ICD-10-CM

## 2017-06-22 DIAGNOSIS — J069 Acute upper respiratory infection, unspecified: Secondary | ICD-10-CM

## 2017-06-22 DIAGNOSIS — E119 Type 2 diabetes mellitus without complications: Secondary | ICD-10-CM

## 2017-06-22 DIAGNOSIS — I10 Essential (primary) hypertension: Secondary | ICD-10-CM

## 2017-06-22 MED ORDER — BENZONATATE 100 MG PO CAPS: ORAL_CAPSULE | ORAL | 3 refills | Status: DC

## 2017-06-22 NOTE — Patient Instructions (Signed)
Upper Respiratory Infection, Adult Most upper respiratory infections (URIs) are caused by a virus. A URI affects the nose, throat, and upper air passages. The most common type of URI is often called "the common cold." Follow these instructions at home:  Take medicines only as told by your doctor.  Gargle warm saltwater or take cough drops to comfort your throat as told by your doctor.  Use a warm mist humidifier or inhale steam from a shower to increase air moisture. This may make it easier to breathe.  Drink enough fluid to keep your pee (urine) clear or pale yellow.  Eat soups and other clear broths.  Have a healthy diet.  Rest as needed.  Go back to work when your fever is gone or your doctor says it is okay. ? You may need to stay home longer to avoid giving your URI to others. ? You can also wear a face mask and wash your hands often to prevent spread of the virus.  Use your inhaler more if you have asthma.  Do not use any tobacco products, including cigarettes, chewing tobacco, or electronic cigarettes. If you need help quitting, ask your doctor. Contact a doctor if:  You are getting worse, not better.  Your symptoms are not helped by medicine.  You have chills.  You are getting more short of breath.  You have brown or red mucus.  You have yellow or brown discharge from your nose.  You have pain in your face, especially when you bend forward.  You have a fever.  You have puffy (swollen) neck glands.  You have pain while swallowing.  You have white areas in the back of your throat. Get help right away if:  You have very bad or constant: ? Headache. ? Ear pain. ? Pain in your forehead, behind your eyes, and over your cheekbones (sinus pain). ? Chest pain.  You have long-lasting (chronic) lung disease and any of the following: ? Wheezing. ? Long-lasting cough. ? Coughing up blood. ? A change in your usual mucus.  You have a stiff neck.  You have  changes in your: ? Vision. ? Hearing. ? Thinking. ? Mood. This information is not intended to replace advice given to you by your health care provider. Make sure you discuss any questions you have with your health care provider. Document Released: 01/14/2008 Document Revised: 03/30/2016 Document Reviewed: 11/02/2013 Elsevier Interactive Patient Education  2018 [employer]  

## 2017-06-22 NOTE — Progress Notes (Signed)
BP 130/84 (BP Location: Left Arm, Patient Position: Sitting, Cuff Size: Large)   Pulse 74   Temp (!) 97.5 F (36.4 C)   Ht 5' 5.5" (1.664 m)   Wt 272 lb (123.4 kg)   SpO2 98%   BMI 44.57 kg/m    Subjective:    Patient ID: Denise Daniels, female    DOB: 08-11-1971, 46 y.o.   MRN: [id number]  HPI: Denise Daniels is a 46 y.o. female presenting on 06/22/2017 for Diabetes and Thyroid Problem   HPI  Pt is still seeing pain doctor in [place].  Pt hasn't gotten her welchol yet  Pt also complains of cough with sore throat, phlegm. began one week ago. pt has taken mucinex pt states not helpful    Relevant past medical, surgical, family and social history reviewed and updated as indicated. Interim medical history since our last visit reviewed. Allergies and medications reviewed and updated.   Current Outpatient Medications:  .  amoxicillin (AMOXIL) 250 MG capsule, Take 250 mg by mouth 2 (two) times daily as needed (for skin)., Disp: , Rfl:  .  atenolol (TENORMIN) 25 MG tablet, Take 1 tablet (25 mg total) by mouth daily., Disp: 30 tablet, Rfl: 2 .  carisoprodol (SOMA) 350 MG tablet, Take 350 mg by mouth daily., Disp: , Rfl:  .  gabapentin (NEURONTIN) 300 MG capsule, Take 300 mg by mouth at bedtime., Disp: , Rfl:  .  ibuprofen (ADVIL,MOTRIN) 200 MG tablet, Take 200 mg by mouth 2 (two) times daily as needed. , Disp: , Rfl:  .  Insulin Glargine (LANTUS) 100 UNIT/ML Solostar Pen, Inject 10 Units into the skin at bedtime., Disp: , Rfl:  .  levothyroxine (SYNTHROID, LEVOTHROID) 150 MCG tablet, Take 1 tablet (150 mcg total) by mouth daily., Disp: 30 tablet, Rfl: 3 .  metFORMIN (GLUCOPHAGE) 1000 MG tablet, Take 1 tablet (1,000 mg total) by mouth 2 (two) times daily with a meal. (Patient taking differently: Take 500 mg 2 (two) times daily with a meal by mouth. ), Disp: 60 tablet, Rfl: 4 .  ranitidine (ZANTAC) 150 MG tablet, Take 150 mg by mouth at bedtime., Disp: , Rfl:  .  sertraline (ZOLOFT)  100 MG tablet, Take 100 mg by mouth daily., Disp: , Rfl:  .  traMADol (ULTRAM) 50 MG tablet, Take 50 mg by mouth every 8 (eight) hours as needed for moderate pain (Fiber Myalgia Flare). , Disp: , Rfl:  .  triamterene-hydrochlorothiazide (DYAZIDE) 37.5-25 MG capsule, Take 1 capsule by mouth daily., Disp: , Rfl:   Review of Systems  Constitutional: Positive for fatigue. Negative for appetite change, chills, diaphoresis, fever and unexpected weight change.  HENT: Positive for congestion and sore throat. Negative for dental problem, drooling, ear pain, facial swelling, hearing loss, mouth sores, sneezing, trouble swallowing and voice change.   Eyes: Positive for visual disturbance. Negative for pain, discharge, redness and itching.  Respiratory: Positive for cough. Negative for choking, shortness of breath and wheezing.   Cardiovascular: Positive for palpitations. Negative for chest pain and leg swelling.  Gastrointestinal: Negative for abdominal pain, blood in stool, constipation, diarrhea and vomiting.  Endocrine: Positive for heat intolerance and polydipsia. Negative for cold intolerance.  Genitourinary: Negative for decreased urine volume, dysuria and hematuria.  Musculoskeletal: Negative for arthralgias, back pain and gait problem.  Skin: Positive for rash.  Allergic/Immunologic: Negative for environmental allergies.  Neurological: Positive for headaches. Negative for seizures, syncope and light-headedness.  Hematological: Negative for adenopathy.  Psychiatric/Behavioral: Positive  for agitation and dysphoric mood. Negative for suicidal ideas. The patient is nervous/anxious.     Per HPI unless specifically indicated above     Objective:    BP 130/84 (BP Location: Left Arm, Patient Position: Sitting, Cuff Size: Large)   Pulse 74   Temp (!) 97.5 F (36.4 C)   Ht 5' 5.5" (1.664 m)   Wt 272 lb (123.4 kg)   SpO2 98%   BMI 44.57 kg/m   Wt Readings from Last 3 Encounters:  06/22/17 272  lb (123.4 kg)  04/20/17 277 lb (125.6 kg)  03/16/17 282 lb 4 oz (128 kg)    Physical Exam  Constitutional: She is oriented to person, place, and time. She appears well-developed and well-nourished.  HENT:  Head: Normocephalic and atraumatic.  Right Ear: Hearing, tympanic membrane, external ear and ear canal normal.  Left Ear: Hearing, tympanic membrane, external ear and ear canal normal.  Nose: Nose normal.  Mouth/Throat: Uvula is midline and oropharynx is clear and moist. No oropharyngeal exudate.  Neck: Neck supple.  Cardiovascular: Normal rate and regular rhythm.  Pulmonary/Chest: Effort normal and breath sounds normal. She has no wheezes.  Abdominal: Soft. Bowel sounds are normal. She exhibits no mass. There is no hepatosplenomegaly. There is no tenderness.  Musculoskeletal: She exhibits no edema.  Lymphadenopathy:    She has no cervical adenopathy.  Neurological: She is alert and oriented to person, place, and time.  Skin: Skin is warm and dry.  Psychiatric: She has a normal mood and affect. Her behavior is normal.  Vitals reviewed.   Results for orders placed or performed during the hospital encounter of 15/17/61  Basic Metabolic Panel (BMET)  Result Value Ref Range   Sodium 141 135 - 145 mmol/L   Potassium 3.9 3.5 - 5.1 mmol/L   Chloride 95 (L) 101 - 111 mmol/L   CO2 25 22 - 32 mmol/L   Glucose, Bld 126 (H) 65 - 99 mg/dL   BUN 17 6 - 20 mg/dL   Creatinine, Ser 0.89 0.44 - 1.00 mg/dL   Calcium 11.2 (H) 8.9 - 10.3 mg/dL   GFR calc non Af Amer >60 >60 mL/min   GFR calc Af Amer >60 >60 mL/min  TSH  Result Value Ref Range   TSH 2.580 0.350 - 4.500 uIU/mL  HgB A1c  Result Value Ref Range   Hgb A1c MFr Bld 6.8 (H) 4.8 - 5.6 %   Mean Plasma Glucose 148.46 mg/dL      Assessment & Plan:    Encounter Diagnoses  Name Primary?  . Type 2 diabetes mellitus without complication, unspecified whether long term insulin use ([place]) Yes  . Essential hypertension, benign   .  Hyperlipidemia, unspecified hyperlipidemia type   . Hypothyroidism, unspecified type   . Other chronic pain   . Morbid obesity, unspecified obesity type ([place])   . Gastroesophageal reflux disease, esophagitis presence not specified   . Upper respiratory tract infection, unspecified type      -Reviewed labs with pt -discussed weight loss which I encouraged her to continue with nutritious diet and regular exercise -refer DM eye exam -pt to Continue current rx -Will have nurse check on welchol -rx tessalon and counseled on care for URI -pt to follow up 3 months. RTO sooner prn

## 2017-06-29 ENCOUNTER — Ambulatory Visit: Payer: Self-pay | Admitting: Physician Assistant

## 2017-06-29 ENCOUNTER — Encounter: Payer: Self-pay | Admitting: Physician Assistant

## 2017-06-29 VITALS — BP 130/78 | HR 79 | Temp 97.5°F | Ht 65.5 in | Wt 268.8 lb

## 2017-06-29 DIAGNOSIS — J069 Acute upper respiratory infection, unspecified: Secondary | ICD-10-CM

## 2017-06-29 MED ORDER — AMOXICILLIN 500 MG PO CAPS: 500.0000 mg | ORAL_CAPSULE | Freq: Three times a day (TID) | ORAL | 0 refills | Status: DC

## 2017-06-29 MED ORDER — PROMETHAZINE-DM 6.25-15 MG/5ML PO SYRP: 5.0000 mL | ORAL_SOLUTION | Freq: Four times a day (QID) | ORAL | 0 refills | Status: DC | PRN

## 2017-06-29 NOTE — Progress Notes (Signed)
   BP 130/78 (BP Location: Left Arm, Patient Position: Sitting, Cuff Size: Large)   Pulse 79   Temp (!) 97.5 F (36.4 C)   Ht 5' 5.5" (1.664 m)   Wt 268 lb 12 oz (121.9 kg)   SpO2 98%   BMI 44.04 kg/m    Subjective:    Patient ID: Denise Daniels, female    DOB: Apr 22, 1971, 46 y.o.   MRN: [id number]  HPI: Denise Daniels is a 46 y.o. female presenting on 06/29/2017 for No chief complaint on file.   HPI   Pt says she feels like her "bronchial is getting worse".   Relevant past medical, surgical, family and social history reviewed and updated as indicated. Interim medical history since our last visit reviewed. Allergies and medications reviewed and updated.  Review of Systems  Constitutional: Positive for fatigue. Negative for appetite change, chills, diaphoresis, fever and unexpected weight change.  HENT: Positive for congestion, sneezing, sore throat and voice change. Negative for dental problem, drooling, ear pain, facial swelling, hearing loss, mouth sores and trouble swallowing.   Eyes: Positive for visual disturbance. Negative for pain, discharge, redness and itching.  Respiratory: Positive for cough, shortness of breath and wheezing. Negative for choking.   Cardiovascular: Positive for palpitations. Negative for chest pain and leg swelling.  Gastrointestinal: Negative for abdominal pain, blood in stool, constipation, diarrhea and vomiting.  Endocrine: Positive for cold intolerance, heat intolerance and polydipsia.  Genitourinary: Negative for decreased urine volume, dysuria and hematuria.  Musculoskeletal: Positive for arthralgias and back pain. Negative for gait problem.  Skin: Positive for rash.  Allergic/Immunologic: Positive for environmental allergies.  Neurological: Positive for headaches. Negative for seizures, syncope and light-headedness.  Hematological: Negative for adenopathy.  Psychiatric/Behavioral: Positive for agitation and dysphoric mood. Negative for suicidal  ideas. The patient is nervous/anxious.     Per HPI unless specifically indicated above     Objective:    BP 130/78 (BP Location: Left Arm, Patient Position: Sitting, Cuff Size: Large)   Pulse 79   Temp (!) 97.5 F (36.4 C)   Ht 5' 5.5" (1.664 m)   Wt 268 lb 12 oz (121.9 kg)   SpO2 98%   BMI 44.04 kg/m   Wt Readings from Last 3 Encounters:  06/29/17 268 lb 12 oz (121.9 kg)  06/22/17 272 lb (123.4 kg)  04/20/17 277 lb (125.6 kg)    Physical Exam  Constitutional: She is oriented to person, place, and time. She appears well-developed and well-nourished.  HENT:  Head: Normocephalic and atraumatic.  Right Ear: Hearing, tympanic membrane, external ear and ear canal normal.  Left Ear: Hearing, tympanic membrane, external ear and ear canal normal.  Nose: Nose normal.  Mouth/Throat: Uvula is midline and oropharynx is clear and moist. No oropharyngeal exudate.  Neck: Neck supple.  Cardiovascular: Normal rate and regular rhythm.  Pulmonary/Chest: Effort normal and breath sounds normal. She has no wheezes.  Lymphadenopathy:    She has no cervical adenopathy.  Neurological: She is alert and oriented to person, place, and time.  Skin: Skin is warm and dry.  Psychiatric: She has a normal mood and affect. Her behavior is normal.  Vitals reviewed.       Assessment & Plan:   Encounter Diagnosis  Name Primary?  Marland Kitchen Upper respiratory tract infection, unspecified type Yes     -rx amoxil and promethazine DM -rest. Fluids. -follow up as scheduled. RTO sooner prn

## 2017-07-16 ENCOUNTER — Other Ambulatory Visit: Payer: Self-pay | Admitting: Physician Assistant

## 2017-07-16 MED ORDER — BENZONATATE 100 MG PO CAPS: ORAL_CAPSULE | ORAL | 3 refills | Status: DC

## 2017-07-22 ENCOUNTER — Other Ambulatory Visit: Payer: Self-pay | Admitting: Physician Assistant

## 2017-08-27 DIAGNOSIS — I10 Essential (primary) hypertension: Secondary | ICD-10-CM | POA: Diagnosis not present

## 2017-08-27 DIAGNOSIS — K210 Gastro-esophageal reflux disease with esophagitis: Secondary | ICD-10-CM | POA: Diagnosis not present

## 2017-08-27 DIAGNOSIS — E038 Other specified hypothyroidism: Secondary | ICD-10-CM | POA: Diagnosis not present

## 2017-08-27 DIAGNOSIS — E782 Mixed hyperlipidemia: Secondary | ICD-10-CM | POA: Diagnosis not present

## 2017-08-27 DIAGNOSIS — E0849 Diabetes mellitus due to underlying condition with other diabetic neurological complication: Secondary | ICD-10-CM | POA: Diagnosis not present

## 2017-08-27 DIAGNOSIS — E668 Other obesity: Secondary | ICD-10-CM | POA: Diagnosis not present

## 2017-08-27 DIAGNOSIS — M797 Fibromyalgia: Secondary | ICD-10-CM | POA: Diagnosis not present

## 2017-09-03 DIAGNOSIS — M542 Cervicalgia: Secondary | ICD-10-CM | POA: Diagnosis not present

## 2017-09-03 DIAGNOSIS — M545 Low back pain: Secondary | ICD-10-CM | POA: Diagnosis not present

## 2017-09-03 DIAGNOSIS — M9902 Segmental and somatic dysfunction of thoracic region: Secondary | ICD-10-CM | POA: Diagnosis not present

## 2017-09-03 DIAGNOSIS — M9901 Segmental and somatic dysfunction of cervical region: Secondary | ICD-10-CM | POA: Diagnosis not present

## 2017-09-03 DIAGNOSIS — M546 Pain in thoracic spine: Secondary | ICD-10-CM | POA: Diagnosis not present

## 2017-09-03 DIAGNOSIS — M9903 Segmental and somatic dysfunction of lumbar region: Secondary | ICD-10-CM | POA: Diagnosis not present

## 2017-09-10 DIAGNOSIS — M9901 Segmental and somatic dysfunction of cervical region: Secondary | ICD-10-CM | POA: Diagnosis not present

## 2017-09-10 DIAGNOSIS — M9903 Segmental and somatic dysfunction of lumbar region: Secondary | ICD-10-CM | POA: Diagnosis not present

## 2017-09-10 DIAGNOSIS — M542 Cervicalgia: Secondary | ICD-10-CM | POA: Diagnosis not present

## 2017-09-10 DIAGNOSIS — M9902 Segmental and somatic dysfunction of thoracic region: Secondary | ICD-10-CM | POA: Diagnosis not present

## 2017-09-10 DIAGNOSIS — M546 Pain in thoracic spine: Secondary | ICD-10-CM | POA: Diagnosis not present

## 2017-09-10 DIAGNOSIS — M545 Low back pain: Secondary | ICD-10-CM | POA: Diagnosis not present

## 2017-09-23 ENCOUNTER — Ambulatory Visit: Payer: Self-pay | Admitting: Physician Assistant

## 2017-09-29 DIAGNOSIS — M542 Cervicalgia: Secondary | ICD-10-CM | POA: Diagnosis not present

## 2017-09-29 DIAGNOSIS — M545 Low back pain: Secondary | ICD-10-CM | POA: Diagnosis not present

## 2017-09-29 DIAGNOSIS — M9901 Segmental and somatic dysfunction of cervical region: Secondary | ICD-10-CM | POA: Diagnosis not present

## 2017-09-29 DIAGNOSIS — M9902 Segmental and somatic dysfunction of thoracic region: Secondary | ICD-10-CM | POA: Diagnosis not present

## 2017-09-29 DIAGNOSIS — M546 Pain in thoracic spine: Secondary | ICD-10-CM | POA: Diagnosis not present

## 2017-09-29 DIAGNOSIS — M9903 Segmental and somatic dysfunction of lumbar region: Secondary | ICD-10-CM | POA: Diagnosis not present

## 2017-10-03 ENCOUNTER — Other Ambulatory Visit: Payer: Self-pay | Admitting: Physician Assistant

## 2017-10-07 ENCOUNTER — Other Ambulatory Visit: Payer: Self-pay | Admitting: Physician Assistant

## 2017-10-11 DIAGNOSIS — R509 Fever, unspecified: Secondary | ICD-10-CM | POA: Diagnosis not present

## 2017-10-11 DIAGNOSIS — J029 Acute pharyngitis, unspecified: Secondary | ICD-10-CM | POA: Diagnosis not present

## 2017-10-11 DIAGNOSIS — R531 Weakness: Secondary | ICD-10-CM | POA: Diagnosis not present

## 2017-10-11 DIAGNOSIS — R5381 Other malaise: Secondary | ICD-10-CM | POA: Diagnosis not present

## 2017-10-11 DIAGNOSIS — R197 Diarrhea, unspecified: Secondary | ICD-10-CM | POA: Diagnosis not present

## 2017-10-13 DIAGNOSIS — J029 Acute pharyngitis, unspecified: Secondary | ICD-10-CM | POA: Diagnosis not present

## 2017-10-27 DIAGNOSIS — R0602 Shortness of breath: Secondary | ICD-10-CM | POA: Diagnosis not present

## 2017-11-04 DIAGNOSIS — J4531 Mild persistent asthma with (acute) exacerbation: Secondary | ICD-10-CM | POA: Diagnosis not present

## 2017-11-05 DIAGNOSIS — R0602 Shortness of breath: Secondary | ICD-10-CM | POA: Diagnosis not present

## 2017-11-05 DIAGNOSIS — R0600 Dyspnea, unspecified: Secondary | ICD-10-CM | POA: Diagnosis not present

## 2017-11-30 DIAGNOSIS — J4531 Mild persistent asthma with (acute) exacerbation: Secondary | ICD-10-CM | POA: Diagnosis not present

## 2017-11-30 DIAGNOSIS — E032 Hypothyroidism due to medicaments and other exogenous substances: Secondary | ICD-10-CM | POA: Diagnosis not present

## 2017-11-30 DIAGNOSIS — E0865 Diabetes mellitus due to underlying condition with hyperglycemia: Secondary | ICD-10-CM | POA: Diagnosis not present

## 2017-11-30 DIAGNOSIS — J301 Allergic rhinitis due to pollen: Secondary | ICD-10-CM | POA: Diagnosis not present

## 2017-11-30 DIAGNOSIS — I10 Essential (primary) hypertension: Secondary | ICD-10-CM | POA: Diagnosis not present

## 2017-12-04 DIAGNOSIS — M9901 Segmental and somatic dysfunction of cervical region: Secondary | ICD-10-CM | POA: Diagnosis not present

## 2017-12-04 DIAGNOSIS — M542 Cervicalgia: Secondary | ICD-10-CM | POA: Diagnosis not present

## 2017-12-04 DIAGNOSIS — M9903 Segmental and somatic dysfunction of lumbar region: Secondary | ICD-10-CM | POA: Diagnosis not present

## 2017-12-04 DIAGNOSIS — M546 Pain in thoracic spine: Secondary | ICD-10-CM | POA: Diagnosis not present

## 2017-12-04 DIAGNOSIS — M545 Low back pain: Secondary | ICD-10-CM | POA: Diagnosis not present

## 2017-12-04 DIAGNOSIS — M9902 Segmental and somatic dysfunction of thoracic region: Secondary | ICD-10-CM | POA: Diagnosis not present

## 2017-12-28 DIAGNOSIS — M9902 Segmental and somatic dysfunction of thoracic region: Secondary | ICD-10-CM | POA: Diagnosis not present

## 2017-12-28 DIAGNOSIS — M542 Cervicalgia: Secondary | ICD-10-CM | POA: Diagnosis not present

## 2017-12-28 DIAGNOSIS — M545 Low back pain: Secondary | ICD-10-CM | POA: Diagnosis not present

## 2017-12-28 DIAGNOSIS — M546 Pain in thoracic spine: Secondary | ICD-10-CM | POA: Diagnosis not present

## 2017-12-28 DIAGNOSIS — M9903 Segmental and somatic dysfunction of lumbar region: Secondary | ICD-10-CM | POA: Diagnosis not present

## 2017-12-28 DIAGNOSIS — M9901 Segmental and somatic dysfunction of cervical region: Secondary | ICD-10-CM | POA: Diagnosis not present

## 2018-01-21 DIAGNOSIS — I83893 Varicose veins of bilateral lower extremities with other complications: Secondary | ICD-10-CM | POA: Diagnosis not present

## 2018-01-22 DIAGNOSIS — M9903 Segmental and somatic dysfunction of lumbar region: Secondary | ICD-10-CM | POA: Diagnosis not present

## 2018-01-22 DIAGNOSIS — M9902 Segmental and somatic dysfunction of thoracic region: Secondary | ICD-10-CM | POA: Diagnosis not present

## 2018-01-22 DIAGNOSIS — M545 Low back pain: Secondary | ICD-10-CM | POA: Diagnosis not present

## 2018-01-22 DIAGNOSIS — M546 Pain in thoracic spine: Secondary | ICD-10-CM | POA: Diagnosis not present

## 2018-01-22 DIAGNOSIS — M542 Cervicalgia: Secondary | ICD-10-CM | POA: Diagnosis not present

## 2018-01-22 DIAGNOSIS — M9901 Segmental and somatic dysfunction of cervical region: Secondary | ICD-10-CM | POA: Diagnosis not present

## 2018-02-02 DIAGNOSIS — M542 Cervicalgia: Secondary | ICD-10-CM | POA: Diagnosis not present

## 2018-02-02 DIAGNOSIS — M9901 Segmental and somatic dysfunction of cervical region: Secondary | ICD-10-CM | POA: Diagnosis not present

## 2018-02-02 DIAGNOSIS — M9903 Segmental and somatic dysfunction of lumbar region: Secondary | ICD-10-CM | POA: Diagnosis not present

## 2018-02-02 DIAGNOSIS — M545 Low back pain: Secondary | ICD-10-CM | POA: Diagnosis not present

## 2018-02-02 DIAGNOSIS — M9902 Segmental and somatic dysfunction of thoracic region: Secondary | ICD-10-CM | POA: Diagnosis not present

## 2018-02-02 DIAGNOSIS — M546 Pain in thoracic spine: Secondary | ICD-10-CM | POA: Diagnosis not present

## 2018-02-10 DIAGNOSIS — I8312 Varicose veins of left lower extremity with inflammation: Secondary | ICD-10-CM | POA: Diagnosis not present

## 2018-02-10 DIAGNOSIS — I83813 Varicose veins of bilateral lower extremities with pain: Secondary | ICD-10-CM | POA: Diagnosis not present

## 2018-02-10 DIAGNOSIS — I8311 Varicose veins of right lower extremity with inflammation: Secondary | ICD-10-CM | POA: Diagnosis not present

## 2018-02-10 DIAGNOSIS — I83893 Varicose veins of bilateral lower extremities with other complications: Secondary | ICD-10-CM | POA: Diagnosis not present

## 2018-02-23 DIAGNOSIS — I10 Essential (primary) hypertension: Secondary | ICD-10-CM | POA: Diagnosis not present

## 2018-02-23 DIAGNOSIS — Z79899 Other long term (current) drug therapy: Secondary | ICD-10-CM | POA: Diagnosis not present

## 2018-02-23 DIAGNOSIS — I83893 Varicose veins of bilateral lower extremities with other complications: Secondary | ICD-10-CM | POA: Diagnosis not present

## 2018-02-23 DIAGNOSIS — E038 Other specified hypothyroidism: Secondary | ICD-10-CM | POA: Diagnosis not present

## 2018-02-23 DIAGNOSIS — R5383 Other fatigue: Secondary | ICD-10-CM | POA: Diagnosis not present

## 2018-02-23 DIAGNOSIS — F325 Major depressive disorder, single episode, in full remission: Secondary | ICD-10-CM | POA: Diagnosis not present

## 2018-02-23 DIAGNOSIS — J4520 Mild intermittent asthma, uncomplicated: Secondary | ICD-10-CM | POA: Diagnosis not present

## 2018-02-23 DIAGNOSIS — E0843 Diabetes mellitus due to underlying condition with diabetic autonomic (poly)neuropathy: Secondary | ICD-10-CM | POA: Diagnosis not present

## 2018-02-23 DIAGNOSIS — E1143 Type 2 diabetes mellitus with diabetic autonomic (poly)neuropathy: Secondary | ICD-10-CM | POA: Diagnosis not present

## 2018-02-23 DIAGNOSIS — J3089 Other allergic rhinitis: Secondary | ICD-10-CM | POA: Diagnosis not present

## 2018-02-23 DIAGNOSIS — M797 Fibromyalgia: Secondary | ICD-10-CM | POA: Diagnosis not present

## 2018-02-26 DIAGNOSIS — M545 Low back pain: Secondary | ICD-10-CM | POA: Diagnosis not present

## 2018-02-26 DIAGNOSIS — M9901 Segmental and somatic dysfunction of cervical region: Secondary | ICD-10-CM | POA: Diagnosis not present

## 2018-02-26 DIAGNOSIS — M9903 Segmental and somatic dysfunction of lumbar region: Secondary | ICD-10-CM | POA: Diagnosis not present

## 2018-02-26 DIAGNOSIS — M542 Cervicalgia: Secondary | ICD-10-CM | POA: Diagnosis not present

## 2018-02-26 DIAGNOSIS — M9902 Segmental and somatic dysfunction of thoracic region: Secondary | ICD-10-CM | POA: Diagnosis not present

## 2018-02-26 DIAGNOSIS — M546 Pain in thoracic spine: Secondary | ICD-10-CM | POA: Diagnosis not present

## 2018-03-04 DIAGNOSIS — I8312 Varicose veins of left lower extremity with inflammation: Secondary | ICD-10-CM | POA: Diagnosis not present

## 2018-03-04 DIAGNOSIS — I8311 Varicose veins of right lower extremity with inflammation: Secondary | ICD-10-CM | POA: Diagnosis not present

## 2018-03-29 DIAGNOSIS — I8311 Varicose veins of right lower extremity with inflammation: Secondary | ICD-10-CM | POA: Diagnosis not present

## 2018-03-29 DIAGNOSIS — I83813 Varicose veins of bilateral lower extremities with pain: Secondary | ICD-10-CM | POA: Diagnosis not present

## 2018-03-29 DIAGNOSIS — I83893 Varicose veins of bilateral lower extremities with other complications: Secondary | ICD-10-CM | POA: Diagnosis not present

## 2018-03-29 DIAGNOSIS — I8312 Varicose veins of left lower extremity with inflammation: Secondary | ICD-10-CM | POA: Diagnosis not present

## 2018-03-31 DIAGNOSIS — I83891 Varicose veins of right lower extremities with other complications: Secondary | ICD-10-CM | POA: Diagnosis not present

## 2018-03-31 DIAGNOSIS — I83811 Varicose veins of right lower extremities with pain: Secondary | ICD-10-CM | POA: Diagnosis not present

## 2018-03-31 DIAGNOSIS — I8311 Varicose veins of right lower extremity with inflammation: Secondary | ICD-10-CM | POA: Diagnosis not present

## 2018-04-02 DIAGNOSIS — I8311 Varicose veins of right lower extremity with inflammation: Secondary | ICD-10-CM | POA: Diagnosis not present

## 2018-04-14 DIAGNOSIS — I83891 Varicose veins of right lower extremities with other complications: Secondary | ICD-10-CM | POA: Diagnosis not present

## 2018-04-14 DIAGNOSIS — I8311 Varicose veins of right lower extremity with inflammation: Secondary | ICD-10-CM | POA: Diagnosis not present

## 2018-04-16 DIAGNOSIS — I8311 Varicose veins of right lower extremity with inflammation: Secondary | ICD-10-CM | POA: Diagnosis not present

## 2018-04-30 DIAGNOSIS — M9903 Segmental and somatic dysfunction of lumbar region: Secondary | ICD-10-CM | POA: Diagnosis not present

## 2018-04-30 DIAGNOSIS — M542 Cervicalgia: Secondary | ICD-10-CM | POA: Diagnosis not present

## 2018-04-30 DIAGNOSIS — M9901 Segmental and somatic dysfunction of cervical region: Secondary | ICD-10-CM | POA: Diagnosis not present

## 2018-04-30 DIAGNOSIS — M545 Low back pain: Secondary | ICD-10-CM | POA: Diagnosis not present

## 2018-04-30 DIAGNOSIS — M546 Pain in thoracic spine: Secondary | ICD-10-CM | POA: Diagnosis not present

## 2018-04-30 DIAGNOSIS — M9902 Segmental and somatic dysfunction of thoracic region: Secondary | ICD-10-CM | POA: Diagnosis not present

## 2018-05-10 DIAGNOSIS — I8311 Varicose veins of right lower extremity with inflammation: Secondary | ICD-10-CM | POA: Diagnosis not present

## 2018-05-10 DIAGNOSIS — I83891 Varicose veins of right lower extremities with other complications: Secondary | ICD-10-CM | POA: Diagnosis not present

## 2018-05-10 DIAGNOSIS — I83811 Varicose veins of right lower extremities with pain: Secondary | ICD-10-CM | POA: Diagnosis not present

## 2018-05-21 DIAGNOSIS — I8311 Varicose veins of right lower extremity with inflammation: Secondary | ICD-10-CM | POA: Diagnosis not present

## 2018-05-21 DIAGNOSIS — I83811 Varicose veins of right lower extremities with pain: Secondary | ICD-10-CM | POA: Diagnosis not present

## 2018-05-25 ENCOUNTER — Other Ambulatory Visit: Payer: Self-pay | Admitting: Internal Medicine

## 2018-05-25 DIAGNOSIS — Z1231 Encounter for screening mammogram for malignant neoplasm of breast: Secondary | ICD-10-CM

## 2018-06-01 ENCOUNTER — Ambulatory Visit
Admission: RE | Admit: 2018-06-01 | Discharge: 2018-06-01 | Disposition: A | Discharge status: Home / Self Care | Payer: Medicare Other | Source: Ambulatory Visit | Attending: Internal Medicine | Admitting: Internal Medicine

## 2018-06-01 DIAGNOSIS — Z1231 Encounter for screening mammogram for malignant neoplasm of breast: Secondary | ICD-10-CM | POA: Diagnosis not present

## 2018-06-02 DIAGNOSIS — I83811 Varicose veins of right lower extremities with pain: Secondary | ICD-10-CM | POA: Diagnosis not present

## 2018-06-02 DIAGNOSIS — I8311 Varicose veins of right lower extremity with inflammation: Secondary | ICD-10-CM | POA: Diagnosis not present

## 2018-06-03 DIAGNOSIS — F325 Major depressive disorder, single episode, in full remission: Secondary | ICD-10-CM | POA: Diagnosis not present

## 2018-06-03 DIAGNOSIS — I83893 Varicose veins of bilateral lower extremities with other complications: Secondary | ICD-10-CM | POA: Diagnosis not present

## 2018-06-03 DIAGNOSIS — I10 Essential (primary) hypertension: Secondary | ICD-10-CM | POA: Diagnosis not present

## 2018-06-03 DIAGNOSIS — J4520 Mild intermittent asthma, uncomplicated: Secondary | ICD-10-CM | POA: Diagnosis not present

## 2018-06-03 DIAGNOSIS — J3089 Other allergic rhinitis: Secondary | ICD-10-CM | POA: Diagnosis not present

## 2018-06-03 DIAGNOSIS — E038 Other specified hypothyroidism: Secondary | ICD-10-CM | POA: Diagnosis not present

## 2018-06-03 DIAGNOSIS — Z0000 Encounter for general adult medical examination without abnormal findings: Secondary | ICD-10-CM | POA: Diagnosis not present

## 2018-06-03 DIAGNOSIS — M797 Fibromyalgia: Secondary | ICD-10-CM | POA: Diagnosis not present

## 2018-06-03 DIAGNOSIS — E1143 Type 2 diabetes mellitus with diabetic autonomic (poly)neuropathy: Secondary | ICD-10-CM | POA: Diagnosis not present

## 2018-06-04 DIAGNOSIS — M9901 Segmental and somatic dysfunction of cervical region: Secondary | ICD-10-CM | POA: Diagnosis not present

## 2018-06-04 DIAGNOSIS — M545 Low back pain: Secondary | ICD-10-CM | POA: Diagnosis not present

## 2018-06-04 DIAGNOSIS — M9902 Segmental and somatic dysfunction of thoracic region: Secondary | ICD-10-CM | POA: Diagnosis not present

## 2018-06-04 DIAGNOSIS — M9903 Segmental and somatic dysfunction of lumbar region: Secondary | ICD-10-CM | POA: Diagnosis not present

## 2018-06-04 DIAGNOSIS — M546 Pain in thoracic spine: Secondary | ICD-10-CM | POA: Diagnosis not present

## 2018-06-04 DIAGNOSIS — M542 Cervicalgia: Secondary | ICD-10-CM | POA: Diagnosis not present

## 2018-06-11 DIAGNOSIS — M9903 Segmental and somatic dysfunction of lumbar region: Secondary | ICD-10-CM | POA: Diagnosis not present

## 2018-06-11 DIAGNOSIS — M9902 Segmental and somatic dysfunction of thoracic region: Secondary | ICD-10-CM | POA: Diagnosis not present

## 2018-06-11 DIAGNOSIS — M9901 Segmental and somatic dysfunction of cervical region: Secondary | ICD-10-CM | POA: Diagnosis not present

## 2018-06-11 DIAGNOSIS — M542 Cervicalgia: Secondary | ICD-10-CM | POA: Diagnosis not present

## 2018-06-11 DIAGNOSIS — M546 Pain in thoracic spine: Secondary | ICD-10-CM | POA: Diagnosis not present

## 2018-06-11 DIAGNOSIS — M545 Low back pain: Secondary | ICD-10-CM | POA: Diagnosis not present

## 2018-06-18 DIAGNOSIS — M546 Pain in thoracic spine: Secondary | ICD-10-CM | POA: Diagnosis not present

## 2018-06-18 DIAGNOSIS — M545 Low back pain: Secondary | ICD-10-CM | POA: Diagnosis not present

## 2018-06-18 DIAGNOSIS — M9901 Segmental and somatic dysfunction of cervical region: Secondary | ICD-10-CM | POA: Diagnosis not present

## 2018-06-18 DIAGNOSIS — M9902 Segmental and somatic dysfunction of thoracic region: Secondary | ICD-10-CM | POA: Diagnosis not present

## 2018-06-18 DIAGNOSIS — M542 Cervicalgia: Secondary | ICD-10-CM | POA: Diagnosis not present

## 2018-06-18 DIAGNOSIS — M9903 Segmental and somatic dysfunction of lumbar region: Secondary | ICD-10-CM | POA: Diagnosis not present

## 2018-06-29 DIAGNOSIS — M546 Pain in thoracic spine: Secondary | ICD-10-CM | POA: Diagnosis not present

## 2018-06-29 DIAGNOSIS — M9902 Segmental and somatic dysfunction of thoracic region: Secondary | ICD-10-CM | POA: Diagnosis not present

## 2018-06-29 DIAGNOSIS — M542 Cervicalgia: Secondary | ICD-10-CM | POA: Diagnosis not present

## 2018-06-29 DIAGNOSIS — M545 Low back pain: Secondary | ICD-10-CM | POA: Diagnosis not present

## 2018-06-29 DIAGNOSIS — M9903 Segmental and somatic dysfunction of lumbar region: Secondary | ICD-10-CM | POA: Diagnosis not present

## 2018-06-29 DIAGNOSIS — M9901 Segmental and somatic dysfunction of cervical region: Secondary | ICD-10-CM | POA: Diagnosis not present

## 2018-06-30 DIAGNOSIS — I8312 Varicose veins of left lower extremity with inflammation: Secondary | ICD-10-CM | POA: Diagnosis not present

## 2018-06-30 DIAGNOSIS — I83892 Varicose veins of left lower extremities with other complications: Secondary | ICD-10-CM | POA: Diagnosis not present

## 2018-07-02 DIAGNOSIS — I8312 Varicose veins of left lower extremity with inflammation: Secondary | ICD-10-CM | POA: Diagnosis not present

## 2018-07-02 DIAGNOSIS — I83892 Varicose veins of left lower extremities with other complications: Secondary | ICD-10-CM | POA: Diagnosis not present

## 2018-07-05 DIAGNOSIS — I8312 Varicose veins of left lower extremity with inflammation: Secondary | ICD-10-CM | POA: Diagnosis not present

## 2018-07-14 DIAGNOSIS — R079 Chest pain, unspecified: Secondary | ICD-10-CM | POA: Diagnosis not present

## 2018-07-14 DIAGNOSIS — E1140 Type 2 diabetes mellitus with diabetic neuropathy, unspecified: Secondary | ICD-10-CM | POA: Diagnosis not present

## 2018-07-14 DIAGNOSIS — Z794 Long term (current) use of insulin: Secondary | ICD-10-CM | POA: Diagnosis not present

## 2018-07-14 DIAGNOSIS — R0600 Dyspnea, unspecified: Secondary | ICD-10-CM | POA: Diagnosis not present

## 2018-07-14 DIAGNOSIS — R002 Palpitations: Secondary | ICD-10-CM | POA: Diagnosis not present

## 2018-07-14 DIAGNOSIS — K219 Gastro-esophageal reflux disease without esophagitis: Secondary | ICD-10-CM | POA: Diagnosis not present

## 2018-07-14 DIAGNOSIS — M797 Fibromyalgia: Secondary | ICD-10-CM | POA: Diagnosis not present

## 2018-07-14 DIAGNOSIS — E079 Disorder of thyroid, unspecified: Secondary | ICD-10-CM | POA: Diagnosis not present

## 2018-07-14 DIAGNOSIS — I10 Essential (primary) hypertension: Secondary | ICD-10-CM | POA: Diagnosis not present

## 2018-07-14 DIAGNOSIS — R0602 Shortness of breath: Secondary | ICD-10-CM | POA: Diagnosis not present

## 2018-07-15 DIAGNOSIS — Z6841 Body Mass Index (BMI) 40.0 and over, adult: Secondary | ICD-10-CM | POA: Diagnosis not present

## 2018-07-15 DIAGNOSIS — I471 Supraventricular tachycardia: Secondary | ICD-10-CM | POA: Diagnosis not present

## 2018-07-15 DIAGNOSIS — J4520 Mild intermittent asthma, uncomplicated: Secondary | ICD-10-CM | POA: Diagnosis not present

## 2018-07-19 DIAGNOSIS — R0602 Shortness of breath: Secondary | ICD-10-CM | POA: Diagnosis not present

## 2018-07-19 DIAGNOSIS — R009 Unspecified abnormalities of heart beat: Secondary | ICD-10-CM | POA: Diagnosis not present

## 2018-07-19 DIAGNOSIS — R072 Precordial pain: Secondary | ICD-10-CM | POA: Diagnosis not present

## 2018-07-23 DIAGNOSIS — E119 Type 2 diabetes mellitus without complications: Secondary | ICD-10-CM | POA: Diagnosis not present

## 2018-07-23 DIAGNOSIS — I10 Essential (primary) hypertension: Secondary | ICD-10-CM | POA: Diagnosis not present

## 2018-07-23 DIAGNOSIS — Z79899 Other long term (current) drug therapy: Secondary | ICD-10-CM | POA: Diagnosis not present

## 2018-07-23 DIAGNOSIS — R002 Palpitations: Secondary | ICD-10-CM | POA: Diagnosis not present

## 2018-07-23 DIAGNOSIS — R0789 Other chest pain: Secondary | ICD-10-CM | POA: Diagnosis not present

## 2018-07-23 DIAGNOSIS — Z794 Long term (current) use of insulin: Secondary | ICD-10-CM | POA: Diagnosis not present

## 2018-07-23 DIAGNOSIS — I200 Unstable angina: Secondary | ICD-10-CM | POA: Diagnosis not present

## 2018-07-23 DIAGNOSIS — E039 Hypothyroidism, unspecified: Secondary | ICD-10-CM | POA: Diagnosis not present

## 2018-07-23 DIAGNOSIS — Z823 Family history of stroke: Secondary | ICD-10-CM | POA: Diagnosis not present

## 2018-07-23 DIAGNOSIS — I517 Cardiomegaly: Secondary | ICD-10-CM | POA: Diagnosis not present

## 2018-07-30 DIAGNOSIS — R002 Palpitations: Secondary | ICD-10-CM | POA: Diagnosis not present

## 2018-08-09 DIAGNOSIS — I83892 Varicose veins of left lower extremities with other complications: Secondary | ICD-10-CM | POA: Diagnosis not present

## 2018-08-09 DIAGNOSIS — I8312 Varicose veins of left lower extremity with inflammation: Secondary | ICD-10-CM | POA: Diagnosis not present

## 2018-08-13 DIAGNOSIS — M9901 Segmental and somatic dysfunction of cervical region: Secondary | ICD-10-CM | POA: Diagnosis not present

## 2018-08-13 DIAGNOSIS — M9902 Segmental and somatic dysfunction of thoracic region: Secondary | ICD-10-CM | POA: Diagnosis not present

## 2018-08-13 DIAGNOSIS — M9903 Segmental and somatic dysfunction of lumbar region: Secondary | ICD-10-CM | POA: Diagnosis not present

## 2018-08-13 DIAGNOSIS — M545 Low back pain: Secondary | ICD-10-CM | POA: Diagnosis not present

## 2018-08-13 DIAGNOSIS — M546 Pain in thoracic spine: Secondary | ICD-10-CM | POA: Diagnosis not present

## 2018-08-13 DIAGNOSIS — M542 Cervicalgia: Secondary | ICD-10-CM | POA: Diagnosis not present

## 2018-08-31 DIAGNOSIS — M7981 Nontraumatic hematoma of soft tissue: Secondary | ICD-10-CM | POA: Diagnosis not present

## 2018-08-31 DIAGNOSIS — I8312 Varicose veins of left lower extremity with inflammation: Secondary | ICD-10-CM | POA: Diagnosis not present

## 2018-08-31 DIAGNOSIS — I83892 Varicose veins of left lower extremities with other complications: Secondary | ICD-10-CM | POA: Diagnosis not present

## 2018-09-06 DIAGNOSIS — E785 Hyperlipidemia, unspecified: Secondary | ICD-10-CM | POA: Diagnosis not present

## 2018-09-06 DIAGNOSIS — M9901 Segmental and somatic dysfunction of cervical region: Secondary | ICD-10-CM | POA: Diagnosis not present

## 2018-09-06 DIAGNOSIS — M9903 Segmental and somatic dysfunction of lumbar region: Secondary | ICD-10-CM | POA: Diagnosis not present

## 2018-09-06 DIAGNOSIS — M9902 Segmental and somatic dysfunction of thoracic region: Secondary | ICD-10-CM | POA: Diagnosis not present

## 2018-09-06 DIAGNOSIS — M546 Pain in thoracic spine: Secondary | ICD-10-CM | POA: Diagnosis not present

## 2018-09-06 DIAGNOSIS — M545 Low back pain: Secondary | ICD-10-CM | POA: Diagnosis not present

## 2018-09-06 DIAGNOSIS — M542 Cervicalgia: Secondary | ICD-10-CM | POA: Diagnosis not present

## 2018-09-09 DIAGNOSIS — E782 Mixed hyperlipidemia: Secondary | ICD-10-CM | POA: Diagnosis not present

## 2018-09-09 DIAGNOSIS — I10 Essential (primary) hypertension: Secondary | ICD-10-CM | POA: Diagnosis not present

## 2018-09-09 DIAGNOSIS — E1165 Type 2 diabetes mellitus with hyperglycemia: Secondary | ICD-10-CM | POA: Diagnosis not present

## 2018-09-09 DIAGNOSIS — Z0000 Encounter for general adult medical examination without abnormal findings: Secondary | ICD-10-CM | POA: Diagnosis not present

## 2018-09-09 DIAGNOSIS — Z6841 Body Mass Index (BMI) 40.0 and over, adult: Secondary | ICD-10-CM | POA: Diagnosis not present

## 2018-09-17 DIAGNOSIS — I83812 Varicose veins of left lower extremities with pain: Secondary | ICD-10-CM | POA: Diagnosis not present

## 2018-09-17 DIAGNOSIS — I8312 Varicose veins of left lower extremity with inflammation: Secondary | ICD-10-CM | POA: Diagnosis not present

## 2018-10-20 DIAGNOSIS — M545 Low back pain: Secondary | ICD-10-CM | POA: Diagnosis not present

## 2018-10-20 DIAGNOSIS — M9903 Segmental and somatic dysfunction of lumbar region: Secondary | ICD-10-CM | POA: Diagnosis not present

## 2018-10-20 DIAGNOSIS — M9902 Segmental and somatic dysfunction of thoracic region: Secondary | ICD-10-CM | POA: Diagnosis not present

## 2018-10-20 DIAGNOSIS — M546 Pain in thoracic spine: Secondary | ICD-10-CM | POA: Diagnosis not present

## 2018-10-20 DIAGNOSIS — M9901 Segmental and somatic dysfunction of cervical region: Secondary | ICD-10-CM | POA: Diagnosis not present

## 2018-10-20 DIAGNOSIS — M542 Cervicalgia: Secondary | ICD-10-CM | POA: Diagnosis not present

## 2018-11-02 DIAGNOSIS — J4531 Mild persistent asthma with (acute) exacerbation: Secondary | ICD-10-CM | POA: Diagnosis not present

## 2018-11-02 DIAGNOSIS — Z6841 Body Mass Index (BMI) 40.0 and over, adult: Secondary | ICD-10-CM | POA: Diagnosis not present

## 2018-11-29 DIAGNOSIS — M9903 Segmental and somatic dysfunction of lumbar region: Secondary | ICD-10-CM | POA: Diagnosis not present

## 2018-11-29 DIAGNOSIS — M542 Cervicalgia: Secondary | ICD-10-CM | POA: Diagnosis not present

## 2018-11-29 DIAGNOSIS — M545 Low back pain: Secondary | ICD-10-CM | POA: Diagnosis not present

## 2018-11-29 DIAGNOSIS — M9901 Segmental and somatic dysfunction of cervical region: Secondary | ICD-10-CM | POA: Diagnosis not present

## 2018-11-29 DIAGNOSIS — M9902 Segmental and somatic dysfunction of thoracic region: Secondary | ICD-10-CM | POA: Diagnosis not present

## 2018-11-29 DIAGNOSIS — M546 Pain in thoracic spine: Secondary | ICD-10-CM | POA: Diagnosis not present

## 2018-12-14 DIAGNOSIS — E038 Other specified hypothyroidism: Secondary | ICD-10-CM | POA: Diagnosis not present

## 2018-12-14 DIAGNOSIS — Z6841 Body Mass Index (BMI) 40.0 and over, adult: Secondary | ICD-10-CM | POA: Diagnosis not present

## 2018-12-14 DIAGNOSIS — I10 Essential (primary) hypertension: Secondary | ICD-10-CM | POA: Diagnosis not present

## 2018-12-14 DIAGNOSIS — E1143 Type 2 diabetes mellitus with diabetic autonomic (poly)neuropathy: Secondary | ICD-10-CM | POA: Diagnosis not present

## 2018-12-14 DIAGNOSIS — J4531 Mild persistent asthma with (acute) exacerbation: Secondary | ICD-10-CM | POA: Diagnosis not present

## 2018-12-14 DIAGNOSIS — G600 Hereditary motor and sensory neuropathy: Secondary | ICD-10-CM | POA: Diagnosis not present

## 2018-12-22 DIAGNOSIS — M9902 Segmental and somatic dysfunction of thoracic region: Secondary | ICD-10-CM | POA: Diagnosis not present

## 2018-12-22 DIAGNOSIS — M545 Low back pain: Secondary | ICD-10-CM | POA: Diagnosis not present

## 2018-12-22 DIAGNOSIS — M9901 Segmental and somatic dysfunction of cervical region: Secondary | ICD-10-CM | POA: Diagnosis not present

## 2018-12-22 DIAGNOSIS — M542 Cervicalgia: Secondary | ICD-10-CM | POA: Diagnosis not present

## 2018-12-22 DIAGNOSIS — M9903 Segmental and somatic dysfunction of lumbar region: Secondary | ICD-10-CM | POA: Diagnosis not present

## 2018-12-22 DIAGNOSIS — M546 Pain in thoracic spine: Secondary | ICD-10-CM | POA: Diagnosis not present

## 2018-12-31 DIAGNOSIS — I8311 Varicose veins of right lower extremity with inflammation: Secondary | ICD-10-CM | POA: Diagnosis not present

## 2018-12-31 DIAGNOSIS — I8312 Varicose veins of left lower extremity with inflammation: Secondary | ICD-10-CM | POA: Diagnosis not present

## 2019-01-21 DIAGNOSIS — M542 Cervicalgia: Secondary | ICD-10-CM | POA: Diagnosis not present

## 2019-01-21 DIAGNOSIS — M545 Low back pain: Secondary | ICD-10-CM | POA: Diagnosis not present

## 2019-01-21 DIAGNOSIS — M9903 Segmental and somatic dysfunction of lumbar region: Secondary | ICD-10-CM | POA: Diagnosis not present

## 2019-01-21 DIAGNOSIS — M546 Pain in thoracic spine: Secondary | ICD-10-CM | POA: Diagnosis not present

## 2019-01-21 DIAGNOSIS — M9902 Segmental and somatic dysfunction of thoracic region: Secondary | ICD-10-CM | POA: Diagnosis not present

## 2019-01-21 DIAGNOSIS — M9901 Segmental and somatic dysfunction of cervical region: Secondary | ICD-10-CM | POA: Diagnosis not present

## 2019-02-14 DIAGNOSIS — Z794 Long term (current) use of insulin: Secondary | ICD-10-CM | POA: Diagnosis not present

## 2019-02-14 DIAGNOSIS — R079 Chest pain, unspecified: Secondary | ICD-10-CM | POA: Diagnosis not present

## 2019-02-14 DIAGNOSIS — I10 Essential (primary) hypertension: Secondary | ICD-10-CM | POA: Diagnosis not present

## 2019-02-14 DIAGNOSIS — E785 Hyperlipidemia, unspecified: Secondary | ICD-10-CM | POA: Diagnosis not present

## 2019-02-14 DIAGNOSIS — E119 Type 2 diabetes mellitus without complications: Secondary | ICD-10-CM | POA: Diagnosis not present

## 2019-02-15 DIAGNOSIS — R079 Chest pain, unspecified: Secondary | ICD-10-CM | POA: Diagnosis not present

## 2019-03-04 DIAGNOSIS — M9903 Segmental and somatic dysfunction of lumbar region: Secondary | ICD-10-CM | POA: Diagnosis not present

## 2019-03-04 DIAGNOSIS — M9902 Segmental and somatic dysfunction of thoracic region: Secondary | ICD-10-CM | POA: Diagnosis not present

## 2019-03-04 DIAGNOSIS — M9901 Segmental and somatic dysfunction of cervical region: Secondary | ICD-10-CM | POA: Diagnosis not present

## 2019-03-04 DIAGNOSIS — M542 Cervicalgia: Secondary | ICD-10-CM | POA: Diagnosis not present

## 2019-03-04 DIAGNOSIS — M545 Low back pain: Secondary | ICD-10-CM | POA: Diagnosis not present

## 2019-03-04 DIAGNOSIS — M546 Pain in thoracic spine: Secondary | ICD-10-CM | POA: Diagnosis not present

## 2019-03-16 DIAGNOSIS — E038 Other specified hypothyroidism: Secondary | ICD-10-CM | POA: Diagnosis not present

## 2019-03-16 DIAGNOSIS — E1143 Type 2 diabetes mellitus with diabetic autonomic (poly)neuropathy: Secondary | ICD-10-CM | POA: Diagnosis not present

## 2019-03-16 DIAGNOSIS — J4531 Mild persistent asthma with (acute) exacerbation: Secondary | ICD-10-CM | POA: Diagnosis not present

## 2019-03-16 DIAGNOSIS — Z6841 Body Mass Index (BMI) 40.0 and over, adult: Secondary | ICD-10-CM | POA: Diagnosis not present

## 2019-03-16 DIAGNOSIS — G600 Hereditary motor and sensory neuropathy: Secondary | ICD-10-CM | POA: Diagnosis not present

## 2019-03-16 DIAGNOSIS — I10 Essential (primary) hypertension: Secondary | ICD-10-CM | POA: Diagnosis not present

## 2019-03-22 DIAGNOSIS — R002 Palpitations: Secondary | ICD-10-CM | POA: Diagnosis not present

## 2019-03-28 DIAGNOSIS — R002 Palpitations: Secondary | ICD-10-CM | POA: Diagnosis not present

## 2019-03-31 DIAGNOSIS — I10 Essential (primary) hypertension: Secondary | ICD-10-CM | POA: Diagnosis not present

## 2019-03-31 DIAGNOSIS — R002 Palpitations: Secondary | ICD-10-CM | POA: Diagnosis not present

## 2019-03-31 DIAGNOSIS — R600 Localized edema: Secondary | ICD-10-CM | POA: Diagnosis not present

## 2019-03-31 DIAGNOSIS — E785 Hyperlipidemia, unspecified: Secondary | ICD-10-CM | POA: Diagnosis not present

## 2019-03-31 DIAGNOSIS — Z794 Long term (current) use of insulin: Secondary | ICD-10-CM | POA: Diagnosis not present

## 2019-03-31 DIAGNOSIS — E119 Type 2 diabetes mellitus without complications: Secondary | ICD-10-CM | POA: Diagnosis not present

## 2019-04-06 DIAGNOSIS — M9903 Segmental and somatic dysfunction of lumbar region: Secondary | ICD-10-CM | POA: Diagnosis not present

## 2019-04-06 DIAGNOSIS — M9902 Segmental and somatic dysfunction of thoracic region: Secondary | ICD-10-CM | POA: Diagnosis not present

## 2019-04-06 DIAGNOSIS — M9901 Segmental and somatic dysfunction of cervical region: Secondary | ICD-10-CM | POA: Diagnosis not present

## 2019-04-06 DIAGNOSIS — M546 Pain in thoracic spine: Secondary | ICD-10-CM | POA: Diagnosis not present

## 2019-04-06 DIAGNOSIS — M542 Cervicalgia: Secondary | ICD-10-CM | POA: Diagnosis not present

## 2019-04-06 DIAGNOSIS — M545 Low back pain: Secondary | ICD-10-CM | POA: Diagnosis not present

## 2019-04-29 ENCOUNTER — Other Ambulatory Visit: Payer: Self-pay | Admitting: Internal Medicine

## 2019-04-29 DIAGNOSIS — Z1231 Encounter for screening mammogram for malignant neoplasm of breast: Secondary | ICD-10-CM

## 2019-04-29 DIAGNOSIS — M545 Low back pain: Secondary | ICD-10-CM | POA: Diagnosis not present

## 2019-04-29 DIAGNOSIS — M9901 Segmental and somatic dysfunction of cervical region: Secondary | ICD-10-CM | POA: Diagnosis not present

## 2019-04-29 DIAGNOSIS — M546 Pain in thoracic spine: Secondary | ICD-10-CM | POA: Diagnosis not present

## 2019-04-29 DIAGNOSIS — M9903 Segmental and somatic dysfunction of lumbar region: Secondary | ICD-10-CM | POA: Diagnosis not present

## 2019-04-29 DIAGNOSIS — M9902 Segmental and somatic dysfunction of thoracic region: Secondary | ICD-10-CM | POA: Diagnosis not present

## 2019-04-29 DIAGNOSIS — M542 Cervicalgia: Secondary | ICD-10-CM | POA: Diagnosis not present

## 2019-05-05 DIAGNOSIS — R002 Palpitations: Secondary | ICD-10-CM | POA: Diagnosis not present

## 2019-05-05 DIAGNOSIS — Z794 Long term (current) use of insulin: Secondary | ICD-10-CM | POA: Diagnosis not present

## 2019-05-05 DIAGNOSIS — R600 Localized edema: Secondary | ICD-10-CM | POA: Diagnosis not present

## 2019-05-05 DIAGNOSIS — I10 Essential (primary) hypertension: Secondary | ICD-10-CM | POA: Diagnosis not present

## 2019-05-05 DIAGNOSIS — E1141 Type 2 diabetes mellitus with diabetic mononeuropathy: Secondary | ICD-10-CM | POA: Diagnosis not present

## 2019-05-05 DIAGNOSIS — E782 Mixed hyperlipidemia: Secondary | ICD-10-CM | POA: Diagnosis not present

## 2019-05-16 DIAGNOSIS — M9901 Segmental and somatic dysfunction of cervical region: Secondary | ICD-10-CM | POA: Diagnosis not present

## 2019-05-16 DIAGNOSIS — M546 Pain in thoracic spine: Secondary | ICD-10-CM | POA: Diagnosis not present

## 2019-05-16 DIAGNOSIS — M545 Low back pain: Secondary | ICD-10-CM | POA: Diagnosis not present

## 2019-05-16 DIAGNOSIS — M9902 Segmental and somatic dysfunction of thoracic region: Secondary | ICD-10-CM | POA: Diagnosis not present

## 2019-05-16 DIAGNOSIS — M9903 Segmental and somatic dysfunction of lumbar region: Secondary | ICD-10-CM | POA: Diagnosis not present

## 2019-05-16 DIAGNOSIS — M542 Cervicalgia: Secondary | ICD-10-CM | POA: Diagnosis not present

## 2019-06-07 DIAGNOSIS — E038 Other specified hypothyroidism: Secondary | ICD-10-CM | POA: Diagnosis not present

## 2019-06-15 ENCOUNTER — Ambulatory Visit
Admission: RE | Admit: 2019-06-15 | Discharge: 2019-06-15 | Disposition: A | Discharge status: Home / Self Care | Payer: Medicare HMO | Source: Ambulatory Visit | Attending: Internal Medicine | Admitting: Internal Medicine

## 2019-06-15 ENCOUNTER — Other Ambulatory Visit: Payer: Self-pay

## 2019-06-15 DIAGNOSIS — Z1231 Encounter for screening mammogram for malignant neoplasm of breast: Secondary | ICD-10-CM | POA: Diagnosis not present

## 2019-06-16 DIAGNOSIS — Z6841 Body Mass Index (BMI) 40.0 and over, adult: Secondary | ICD-10-CM | POA: Diagnosis not present

## 2019-06-16 DIAGNOSIS — G600 Hereditary motor and sensory neuropathy: Secondary | ICD-10-CM | POA: Diagnosis not present

## 2019-06-16 DIAGNOSIS — E1143 Type 2 diabetes mellitus with diabetic autonomic (poly)neuropathy: Secondary | ICD-10-CM | POA: Diagnosis not present

## 2019-06-16 DIAGNOSIS — I10 Essential (primary) hypertension: Secondary | ICD-10-CM | POA: Diagnosis not present

## 2019-06-16 DIAGNOSIS — Z0000 Encounter for general adult medical examination without abnormal findings: Secondary | ICD-10-CM | POA: Diagnosis not present

## 2019-06-16 DIAGNOSIS — Z1389 Encounter for screening for other disorder: Secondary | ICD-10-CM | POA: Diagnosis not present

## 2019-06-16 DIAGNOSIS — E038 Other specified hypothyroidism: Secondary | ICD-10-CM | POA: Diagnosis not present

## 2019-06-16 DIAGNOSIS — J4531 Mild persistent asthma with (acute) exacerbation: Secondary | ICD-10-CM | POA: Diagnosis not present

## 2019-06-17 DIAGNOSIS — E1140 Type 2 diabetes mellitus with diabetic neuropathy, unspecified: Secondary | ICD-10-CM | POA: Diagnosis not present

## 2019-06-21 DIAGNOSIS — E11319 Type 2 diabetes mellitus with unspecified diabetic retinopathy without macular edema: Secondary | ICD-10-CM | POA: Diagnosis not present

## 2019-06-27 DIAGNOSIS — M546 Pain in thoracic spine: Secondary | ICD-10-CM | POA: Diagnosis not present

## 2019-06-27 DIAGNOSIS — M9902 Segmental and somatic dysfunction of thoracic region: Secondary | ICD-10-CM | POA: Diagnosis not present

## 2019-06-27 DIAGNOSIS — M9901 Segmental and somatic dysfunction of cervical region: Secondary | ICD-10-CM | POA: Diagnosis not present

## 2019-06-27 DIAGNOSIS — M545 Low back pain: Secondary | ICD-10-CM | POA: Diagnosis not present

## 2019-06-27 DIAGNOSIS — M542 Cervicalgia: Secondary | ICD-10-CM | POA: Diagnosis not present

## 2019-06-27 DIAGNOSIS — M9903 Segmental and somatic dysfunction of lumbar region: Secondary | ICD-10-CM | POA: Diagnosis not present

## 2019-07-20 DIAGNOSIS — M542 Cervicalgia: Secondary | ICD-10-CM | POA: Diagnosis not present

## 2019-07-20 DIAGNOSIS — M546 Pain in thoracic spine: Secondary | ICD-10-CM | POA: Diagnosis not present

## 2019-07-20 DIAGNOSIS — M9901 Segmental and somatic dysfunction of cervical region: Secondary | ICD-10-CM | POA: Diagnosis not present

## 2019-07-20 DIAGNOSIS — M545 Low back pain: Secondary | ICD-10-CM | POA: Diagnosis not present

## 2019-07-20 DIAGNOSIS — M9902 Segmental and somatic dysfunction of thoracic region: Secondary | ICD-10-CM | POA: Diagnosis not present

## 2019-07-20 DIAGNOSIS — M9903 Segmental and somatic dysfunction of lumbar region: Secondary | ICD-10-CM | POA: Diagnosis not present

## 2019-07-29 DIAGNOSIS — H5201 Hypermetropia, right eye: Secondary | ICD-10-CM | POA: Diagnosis not present

## 2019-08-29 DIAGNOSIS — M542 Cervicalgia: Secondary | ICD-10-CM | POA: Diagnosis not present

## 2019-08-29 DIAGNOSIS — M9902 Segmental and somatic dysfunction of thoracic region: Secondary | ICD-10-CM | POA: Diagnosis not present

## 2019-08-29 DIAGNOSIS — M545 Low back pain: Secondary | ICD-10-CM | POA: Diagnosis not present

## 2019-08-29 DIAGNOSIS — M9901 Segmental and somatic dysfunction of cervical region: Secondary | ICD-10-CM | POA: Diagnosis not present

## 2019-08-29 DIAGNOSIS — M9903 Segmental and somatic dysfunction of lumbar region: Secondary | ICD-10-CM | POA: Diagnosis not present

## 2019-08-29 DIAGNOSIS — M546 Pain in thoracic spine: Secondary | ICD-10-CM | POA: Diagnosis not present

## 2019-09-15 DIAGNOSIS — Z1389 Encounter for screening for other disorder: Secondary | ICD-10-CM | POA: Diagnosis not present

## 2019-09-15 DIAGNOSIS — E1143 Type 2 diabetes mellitus with diabetic autonomic (poly)neuropathy: Secondary | ICD-10-CM | POA: Diagnosis not present

## 2019-09-15 DIAGNOSIS — Z6841 Body Mass Index (BMI) 40.0 and over, adult: Secondary | ICD-10-CM | POA: Diagnosis not present

## 2019-09-15 DIAGNOSIS — J4531 Mild persistent asthma with (acute) exacerbation: Secondary | ICD-10-CM | POA: Diagnosis not present

## 2019-09-15 DIAGNOSIS — G600 Hereditary motor and sensory neuropathy: Secondary | ICD-10-CM | POA: Diagnosis not present

## 2019-09-15 DIAGNOSIS — I10 Essential (primary) hypertension: Secondary | ICD-10-CM | POA: Diagnosis not present

## 2019-09-15 DIAGNOSIS — Z0000 Encounter for general adult medical examination without abnormal findings: Secondary | ICD-10-CM | POA: Diagnosis not present

## 2019-09-15 DIAGNOSIS — E038 Other specified hypothyroidism: Secondary | ICD-10-CM | POA: Diagnosis not present

## 2019-09-19 DIAGNOSIS — M546 Pain in thoracic spine: Secondary | ICD-10-CM | POA: Diagnosis not present

## 2019-09-19 DIAGNOSIS — M9903 Segmental and somatic dysfunction of lumbar region: Secondary | ICD-10-CM | POA: Diagnosis not present

## 2019-09-19 DIAGNOSIS — M545 Low back pain: Secondary | ICD-10-CM | POA: Diagnosis not present

## 2019-09-19 DIAGNOSIS — M542 Cervicalgia: Secondary | ICD-10-CM | POA: Diagnosis not present

## 2019-09-19 DIAGNOSIS — M9902 Segmental and somatic dysfunction of thoracic region: Secondary | ICD-10-CM | POA: Diagnosis not present

## 2019-09-19 DIAGNOSIS — M9901 Segmental and somatic dysfunction of cervical region: Secondary | ICD-10-CM | POA: Diagnosis not present

## 2019-10-03 DIAGNOSIS — M542 Cervicalgia: Secondary | ICD-10-CM | POA: Diagnosis not present

## 2019-10-03 DIAGNOSIS — M9903 Segmental and somatic dysfunction of lumbar region: Secondary | ICD-10-CM | POA: Diagnosis not present

## 2019-10-03 DIAGNOSIS — M545 Low back pain: Secondary | ICD-10-CM | POA: Diagnosis not present

## 2019-10-03 DIAGNOSIS — M9901 Segmental and somatic dysfunction of cervical region: Secondary | ICD-10-CM | POA: Diagnosis not present

## 2019-10-03 DIAGNOSIS — M9902 Segmental and somatic dysfunction of thoracic region: Secondary | ICD-10-CM | POA: Diagnosis not present

## 2019-10-03 DIAGNOSIS — M546 Pain in thoracic spine: Secondary | ICD-10-CM | POA: Diagnosis not present

## 2019-10-21 DIAGNOSIS — M542 Cervicalgia: Secondary | ICD-10-CM | POA: Diagnosis not present

## 2019-10-21 DIAGNOSIS — M9901 Segmental and somatic dysfunction of cervical region: Secondary | ICD-10-CM | POA: Diagnosis not present

## 2019-10-21 DIAGNOSIS — M9903 Segmental and somatic dysfunction of lumbar region: Secondary | ICD-10-CM | POA: Diagnosis not present

## 2019-10-21 DIAGNOSIS — M9902 Segmental and somatic dysfunction of thoracic region: Secondary | ICD-10-CM | POA: Diagnosis not present

## 2019-10-21 DIAGNOSIS — M546 Pain in thoracic spine: Secondary | ICD-10-CM | POA: Diagnosis not present

## 2019-10-21 DIAGNOSIS — M545 Low back pain: Secondary | ICD-10-CM | POA: Diagnosis not present

## 2019-11-02 DIAGNOSIS — M9903 Segmental and somatic dysfunction of lumbar region: Secondary | ICD-10-CM | POA: Diagnosis not present

## 2019-11-02 DIAGNOSIS — M545 Low back pain: Secondary | ICD-10-CM | POA: Diagnosis not present

## 2019-11-02 DIAGNOSIS — M9902 Segmental and somatic dysfunction of thoracic region: Secondary | ICD-10-CM | POA: Diagnosis not present

## 2019-11-02 DIAGNOSIS — M546 Pain in thoracic spine: Secondary | ICD-10-CM | POA: Diagnosis not present

## 2019-11-02 DIAGNOSIS — M9901 Segmental and somatic dysfunction of cervical region: Secondary | ICD-10-CM | POA: Diagnosis not present

## 2019-11-02 DIAGNOSIS — M542 Cervicalgia: Secondary | ICD-10-CM | POA: Diagnosis not present

## 2019-11-14 DIAGNOSIS — M9903 Segmental and somatic dysfunction of lumbar region: Secondary | ICD-10-CM | POA: Diagnosis not present

## 2019-11-14 DIAGNOSIS — M546 Pain in thoracic spine: Secondary | ICD-10-CM | POA: Diagnosis not present

## 2019-11-14 DIAGNOSIS — M545 Low back pain: Secondary | ICD-10-CM | POA: Diagnosis not present

## 2019-11-14 DIAGNOSIS — M9901 Segmental and somatic dysfunction of cervical region: Secondary | ICD-10-CM | POA: Diagnosis not present

## 2019-11-14 DIAGNOSIS — M542 Cervicalgia: Secondary | ICD-10-CM | POA: Diagnosis not present

## 2019-11-14 DIAGNOSIS — M9902 Segmental and somatic dysfunction of thoracic region: Secondary | ICD-10-CM | POA: Diagnosis not present

## 2019-11-15 DIAGNOSIS — I10 Essential (primary) hypertension: Secondary | ICD-10-CM | POA: Diagnosis not present

## 2019-11-15 DIAGNOSIS — R0789 Other chest pain: Secondary | ICD-10-CM | POA: Diagnosis not present

## 2019-11-15 DIAGNOSIS — E782 Mixed hyperlipidemia: Secondary | ICD-10-CM | POA: Diagnosis not present

## 2019-11-15 DIAGNOSIS — R0600 Dyspnea, unspecified: Secondary | ICD-10-CM | POA: Diagnosis not present

## 2019-11-15 DIAGNOSIS — Z794 Long term (current) use of insulin: Secondary | ICD-10-CM | POA: Diagnosis not present

## 2019-11-15 DIAGNOSIS — E1141 Type 2 diabetes mellitus with diabetic mononeuropathy: Secondary | ICD-10-CM | POA: Diagnosis not present

## 2019-11-15 DIAGNOSIS — R002 Palpitations: Secondary | ICD-10-CM | POA: Diagnosis not present

## 2019-11-28 DIAGNOSIS — M546 Pain in thoracic spine: Secondary | ICD-10-CM | POA: Diagnosis not present

## 2019-11-28 DIAGNOSIS — M9903 Segmental and somatic dysfunction of lumbar region: Secondary | ICD-10-CM | POA: Diagnosis not present

## 2019-11-28 DIAGNOSIS — M9902 Segmental and somatic dysfunction of thoracic region: Secondary | ICD-10-CM | POA: Diagnosis not present

## 2019-11-28 DIAGNOSIS — M9901 Segmental and somatic dysfunction of cervical region: Secondary | ICD-10-CM | POA: Diagnosis not present

## 2019-11-28 DIAGNOSIS — M545 Low back pain: Secondary | ICD-10-CM | POA: Diagnosis not present

## 2019-11-28 DIAGNOSIS — M542 Cervicalgia: Secondary | ICD-10-CM | POA: Diagnosis not present

## 2019-12-12 DIAGNOSIS — M9903 Segmental and somatic dysfunction of lumbar region: Secondary | ICD-10-CM | POA: Diagnosis not present

## 2019-12-12 DIAGNOSIS — M9902 Segmental and somatic dysfunction of thoracic region: Secondary | ICD-10-CM | POA: Diagnosis not present

## 2019-12-12 DIAGNOSIS — M542 Cervicalgia: Secondary | ICD-10-CM | POA: Diagnosis not present

## 2019-12-12 DIAGNOSIS — M546 Pain in thoracic spine: Secondary | ICD-10-CM | POA: Diagnosis not present

## 2019-12-12 DIAGNOSIS — M9901 Segmental and somatic dysfunction of cervical region: Secondary | ICD-10-CM | POA: Diagnosis not present

## 2019-12-12 DIAGNOSIS — M545 Low back pain: Secondary | ICD-10-CM | POA: Diagnosis not present

## 2019-12-15 DIAGNOSIS — Z6841 Body Mass Index (BMI) 40.0 and over, adult: Secondary | ICD-10-CM | POA: Diagnosis not present

## 2019-12-15 DIAGNOSIS — E09620 Drug or chemical induced diabetes mellitus with diabetic dermatitis: Secondary | ICD-10-CM | POA: Diagnosis not present

## 2019-12-15 DIAGNOSIS — E038 Other specified hypothyroidism: Secondary | ICD-10-CM | POA: Diagnosis not present

## 2019-12-15 DIAGNOSIS — I10 Essential (primary) hypertension: Secondary | ICD-10-CM | POA: Diagnosis not present

## 2019-12-15 DIAGNOSIS — E1143 Type 2 diabetes mellitus with diabetic autonomic (poly)neuropathy: Secondary | ICD-10-CM | POA: Diagnosis not present

## 2019-12-15 DIAGNOSIS — Z0000 Encounter for general adult medical examination without abnormal findings: Secondary | ICD-10-CM | POA: Diagnosis not present

## 2019-12-15 DIAGNOSIS — J4531 Mild persistent asthma with (acute) exacerbation: Secondary | ICD-10-CM | POA: Diagnosis not present

## 2019-12-15 DIAGNOSIS — G600 Hereditary motor and sensory neuropathy: Secondary | ICD-10-CM | POA: Diagnosis not present

## 2019-12-30 DIAGNOSIS — M9903 Segmental and somatic dysfunction of lumbar region: Secondary | ICD-10-CM | POA: Diagnosis not present

## 2019-12-30 DIAGNOSIS — M9902 Segmental and somatic dysfunction of thoracic region: Secondary | ICD-10-CM | POA: Diagnosis not present

## 2019-12-30 DIAGNOSIS — M545 Low back pain: Secondary | ICD-10-CM | POA: Diagnosis not present

## 2019-12-30 DIAGNOSIS — M9901 Segmental and somatic dysfunction of cervical region: Secondary | ICD-10-CM | POA: Diagnosis not present

## 2019-12-30 DIAGNOSIS — M546 Pain in thoracic spine: Secondary | ICD-10-CM | POA: Diagnosis not present

## 2019-12-30 DIAGNOSIS — M542 Cervicalgia: Secondary | ICD-10-CM | POA: Diagnosis not present

## 2020-01-18 DIAGNOSIS — M546 Pain in thoracic spine: Secondary | ICD-10-CM | POA: Diagnosis not present

## 2020-01-18 DIAGNOSIS — M9901 Segmental and somatic dysfunction of cervical region: Secondary | ICD-10-CM | POA: Diagnosis not present

## 2020-01-18 DIAGNOSIS — M545 Low back pain: Secondary | ICD-10-CM | POA: Diagnosis not present

## 2020-01-18 DIAGNOSIS — M542 Cervicalgia: Secondary | ICD-10-CM | POA: Diagnosis not present

## 2020-01-18 DIAGNOSIS — M9903 Segmental and somatic dysfunction of lumbar region: Secondary | ICD-10-CM | POA: Diagnosis not present

## 2020-01-18 DIAGNOSIS — M9902 Segmental and somatic dysfunction of thoracic region: Secondary | ICD-10-CM | POA: Diagnosis not present

## 2020-01-25 DIAGNOSIS — M170 Bilateral primary osteoarthritis of knee: Secondary | ICD-10-CM | POA: Diagnosis not present

## 2020-01-25 DIAGNOSIS — M25562 Pain in left knee: Secondary | ICD-10-CM | POA: Diagnosis not present

## 2020-01-25 DIAGNOSIS — M25561 Pain in right knee: Secondary | ICD-10-CM | POA: Diagnosis not present

## 2020-01-25 DIAGNOSIS — G8929 Other chronic pain: Secondary | ICD-10-CM | POA: Diagnosis not present

## 2020-01-25 DIAGNOSIS — M25361 Other instability, right knee: Secondary | ICD-10-CM | POA: Diagnosis not present

## 2020-02-06 DIAGNOSIS — M9903 Segmental and somatic dysfunction of lumbar region: Secondary | ICD-10-CM | POA: Diagnosis not present

## 2020-02-06 DIAGNOSIS — M545 Low back pain: Secondary | ICD-10-CM | POA: Diagnosis not present

## 2020-02-06 DIAGNOSIS — M546 Pain in thoracic spine: Secondary | ICD-10-CM | POA: Diagnosis not present

## 2020-02-06 DIAGNOSIS — M9901 Segmental and somatic dysfunction of cervical region: Secondary | ICD-10-CM | POA: Diagnosis not present

## 2020-02-06 DIAGNOSIS — M9902 Segmental and somatic dysfunction of thoracic region: Secondary | ICD-10-CM | POA: Diagnosis not present

## 2020-02-06 DIAGNOSIS — M542 Cervicalgia: Secondary | ICD-10-CM | POA: Diagnosis not present

## 2020-02-08 DIAGNOSIS — M7521 Bicipital tendinitis, right shoulder: Secondary | ICD-10-CM | POA: Diagnosis not present

## 2020-02-08 DIAGNOSIS — M12811 Other specific arthropathies, not elsewhere classified, right shoulder: Secondary | ICD-10-CM | POA: Diagnosis not present

## 2020-02-08 DIAGNOSIS — M7541 Impingement syndrome of right shoulder: Secondary | ICD-10-CM | POA: Diagnosis not present

## 2020-02-20 DIAGNOSIS — M9902 Segmental and somatic dysfunction of thoracic region: Secondary | ICD-10-CM | POA: Diagnosis not present

## 2020-02-20 DIAGNOSIS — M545 Low back pain: Secondary | ICD-10-CM | POA: Diagnosis not present

## 2020-02-20 DIAGNOSIS — M542 Cervicalgia: Secondary | ICD-10-CM | POA: Diagnosis not present

## 2020-02-20 DIAGNOSIS — M9903 Segmental and somatic dysfunction of lumbar region: Secondary | ICD-10-CM | POA: Diagnosis not present

## 2020-02-20 DIAGNOSIS — M546 Pain in thoracic spine: Secondary | ICD-10-CM | POA: Diagnosis not present

## 2020-02-20 DIAGNOSIS — M9901 Segmental and somatic dysfunction of cervical region: Secondary | ICD-10-CM | POA: Diagnosis not present

## 2020-03-05 IMAGING — MG DIGITAL SCREENING BILATERAL MAMMOGRAM WITH TOMO AND CAD
8 series · 8 of 24 positions shown · non-contrast
Comparison: Previous exam(s).

CLINICAL DATA: Screening.

EXAM:
DIGITAL SCREENING BILATERAL MAMMOGRAM WITH TOMO AND CAD

[L CC synth-2D]
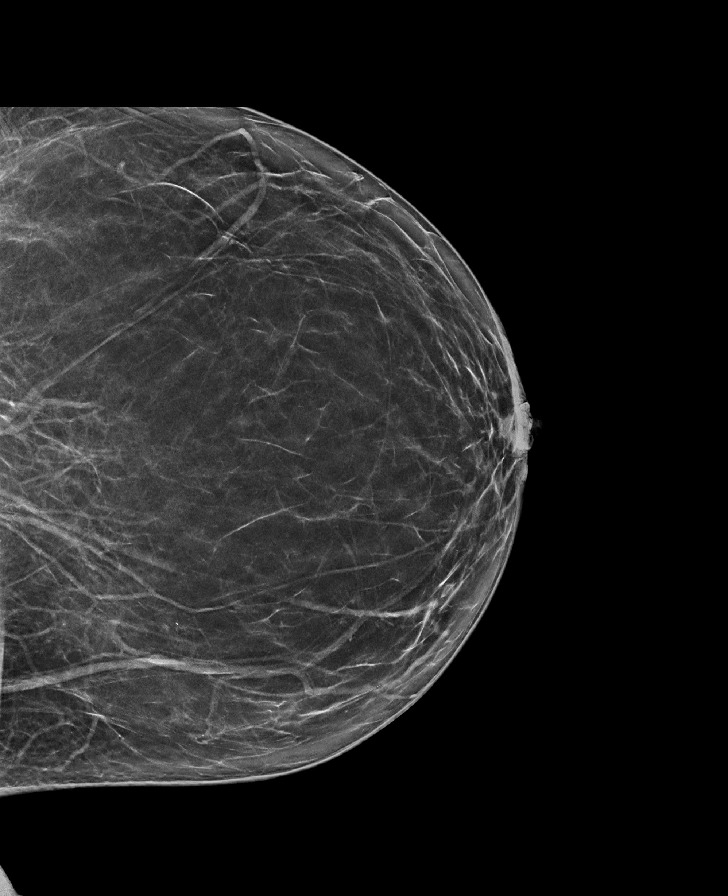

[L MLO synth-2D]
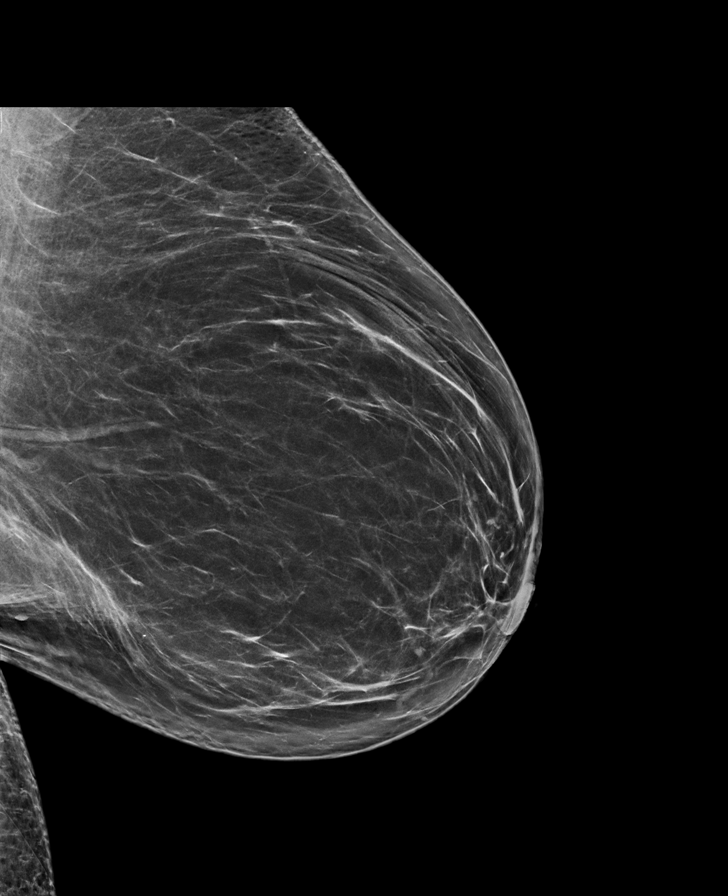

[R MLO synth-2D]
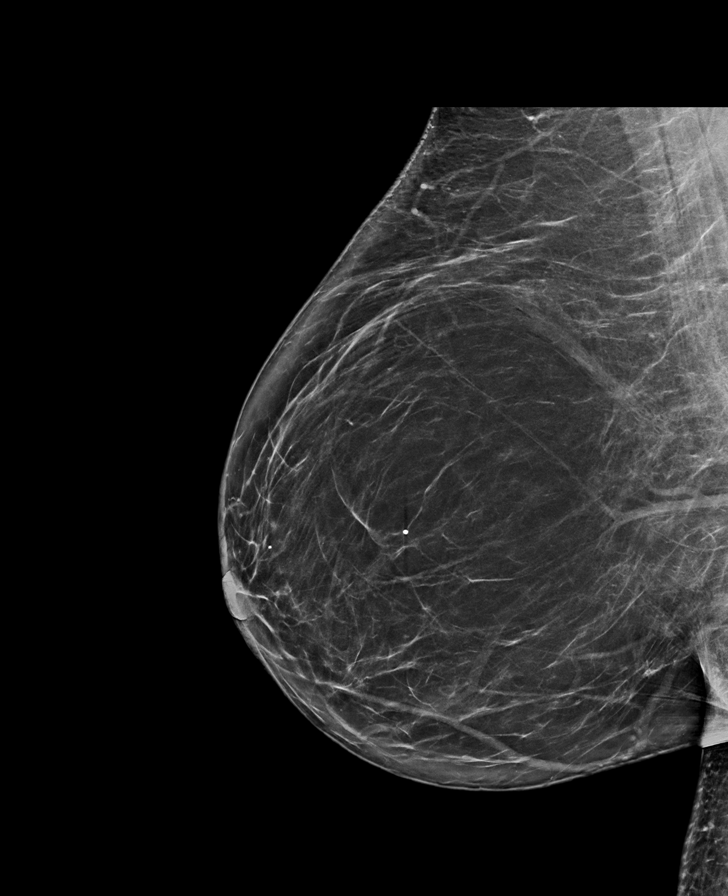

[R CC synth-2D]
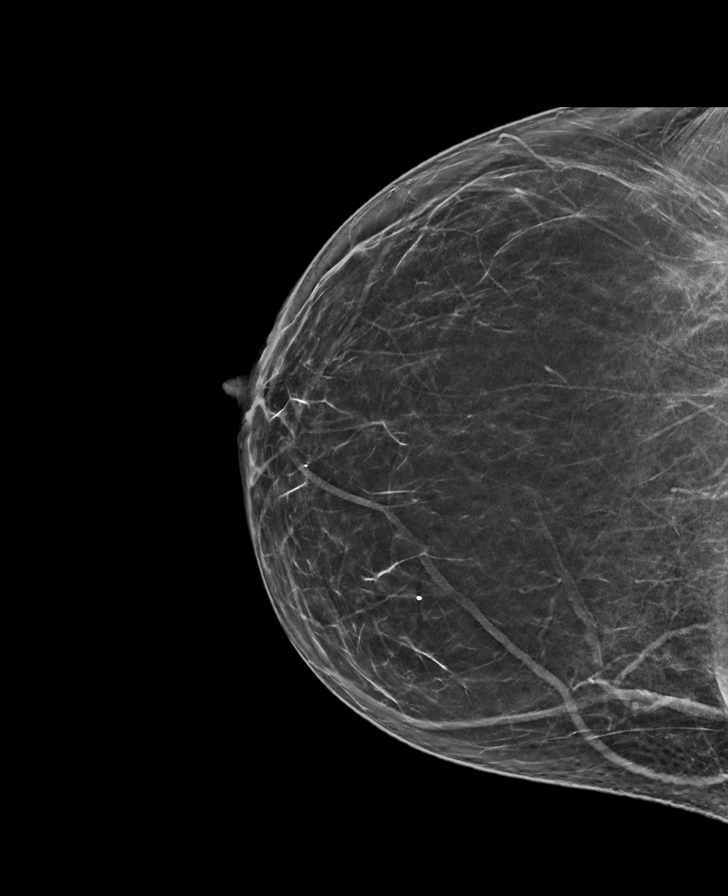

[R MLO tomo · tomo slice 43/86.0]
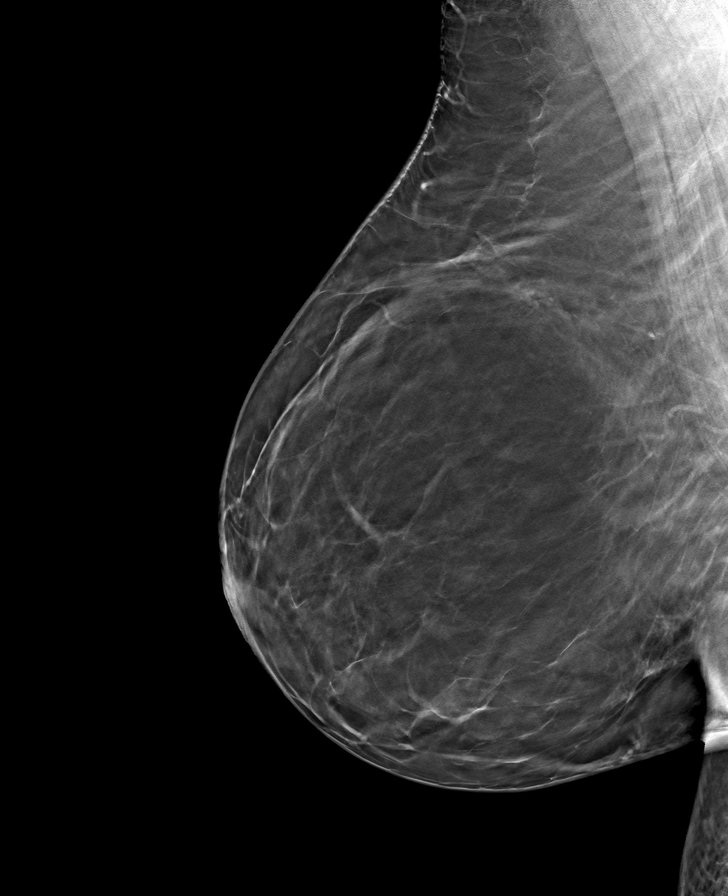

[R CC tomo · tomo slice 35/70.0]
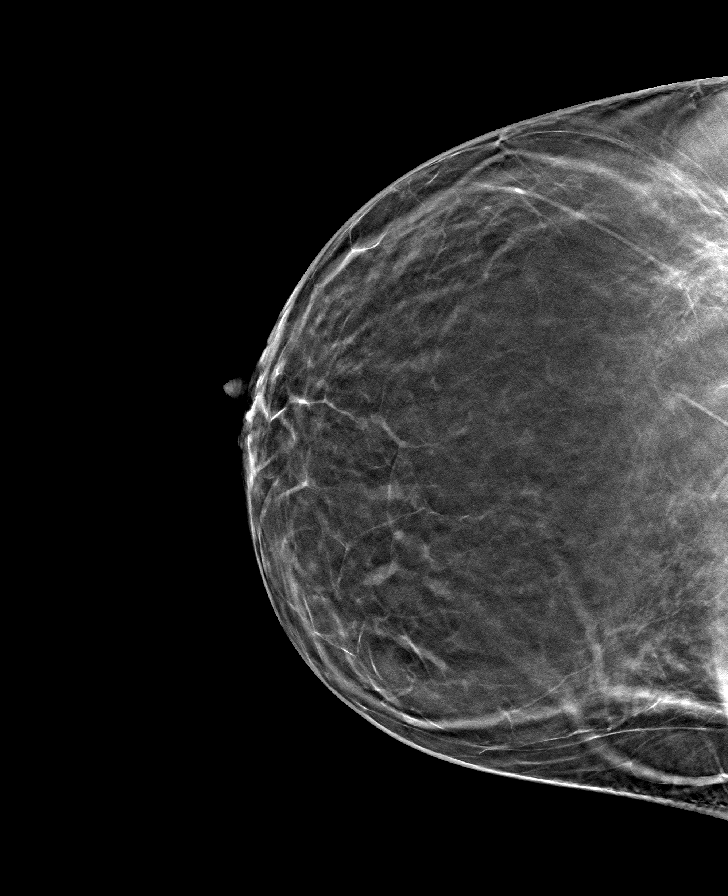

[L CC tomo · tomo slice 37/73.0]
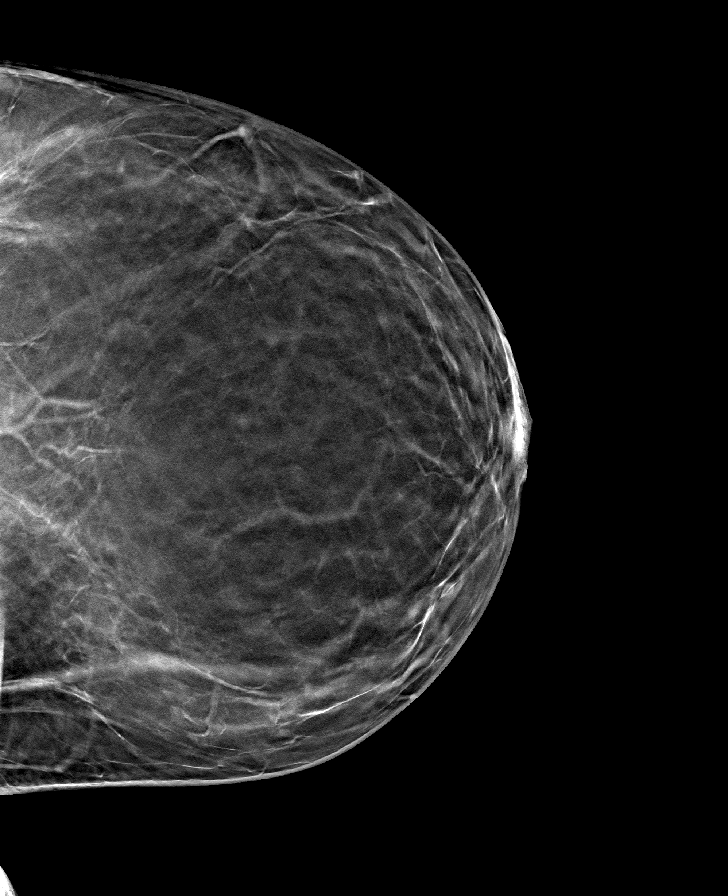

[L MLO tomo · tomo slice 43/86.0]
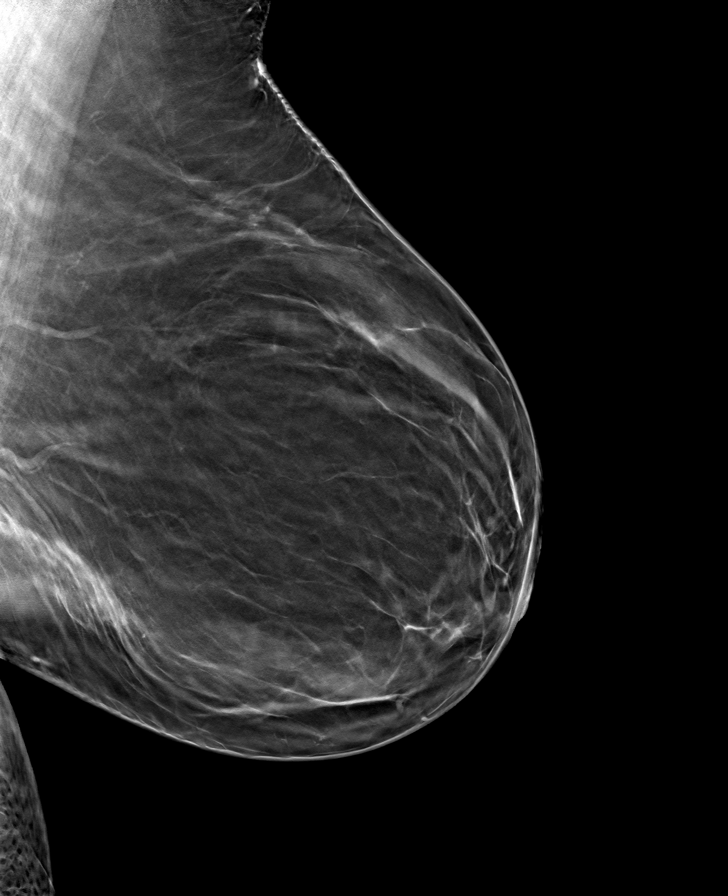

[8 of 24 positions shown; findings below may reference images not displayed]

ACR Breast Density Category b: There are scattered areas of
fibroglandular density.
FINDINGS: There are no findings suspicious for malignancy. Images were
processed with CAD.
IMPRESSION: No mammographic evidence of malignancy. A result letter of this
screening mammogram will be mailed directly to the patient.

RECOMMENDATION:
Screening mammogram in one year. (Code:[id number])

BI-RADS CATEGORY  1: Negative.

## 2020-03-07 DIAGNOSIS — M9901 Segmental and somatic dysfunction of cervical region: Secondary | ICD-10-CM | POA: Diagnosis not present

## 2020-03-07 DIAGNOSIS — M542 Cervicalgia: Secondary | ICD-10-CM | POA: Diagnosis not present

## 2020-03-07 DIAGNOSIS — M9903 Segmental and somatic dysfunction of lumbar region: Secondary | ICD-10-CM | POA: Diagnosis not present

## 2020-03-07 DIAGNOSIS — M7541 Impingement syndrome of right shoulder: Secondary | ICD-10-CM | POA: Diagnosis not present

## 2020-03-07 DIAGNOSIS — M9902 Segmental and somatic dysfunction of thoracic region: Secondary | ICD-10-CM | POA: Diagnosis not present

## 2020-03-07 DIAGNOSIS — M7521 Bicipital tendinitis, right shoulder: Secondary | ICD-10-CM | POA: Diagnosis not present

## 2020-03-07 DIAGNOSIS — M545 Low back pain: Secondary | ICD-10-CM | POA: Diagnosis not present

## 2020-03-07 DIAGNOSIS — M546 Pain in thoracic spine: Secondary | ICD-10-CM | POA: Diagnosis not present

## 2020-03-07 DIAGNOSIS — M12811 Other specific arthropathies, not elsewhere classified, right shoulder: Secondary | ICD-10-CM | POA: Diagnosis not present

## 2020-03-21 DIAGNOSIS — M9902 Segmental and somatic dysfunction of thoracic region: Secondary | ICD-10-CM | POA: Diagnosis not present

## 2020-03-21 DIAGNOSIS — M545 Low back pain: Secondary | ICD-10-CM | POA: Diagnosis not present

## 2020-03-21 DIAGNOSIS — M546 Pain in thoracic spine: Secondary | ICD-10-CM | POA: Diagnosis not present

## 2020-03-21 DIAGNOSIS — M542 Cervicalgia: Secondary | ICD-10-CM | POA: Diagnosis not present

## 2020-03-21 DIAGNOSIS — M9901 Segmental and somatic dysfunction of cervical region: Secondary | ICD-10-CM | POA: Diagnosis not present

## 2020-03-21 DIAGNOSIS — M9903 Segmental and somatic dysfunction of lumbar region: Secondary | ICD-10-CM | POA: Diagnosis not present

## 2020-03-22 DIAGNOSIS — M7521 Bicipital tendinitis, right shoulder: Secondary | ICD-10-CM | POA: Diagnosis not present

## 2020-03-22 DIAGNOSIS — M19011 Primary osteoarthritis, right shoulder: Secondary | ICD-10-CM | POA: Diagnosis not present

## 2020-03-22 DIAGNOSIS — M25511 Pain in right shoulder: Secondary | ICD-10-CM | POA: Diagnosis not present

## 2020-03-26 DIAGNOSIS — J4531 Mild persistent asthma with (acute) exacerbation: Secondary | ICD-10-CM | POA: Diagnosis not present

## 2020-03-26 DIAGNOSIS — Z0000 Encounter for general adult medical examination without abnormal findings: Secondary | ICD-10-CM | POA: Diagnosis not present

## 2020-03-26 DIAGNOSIS — E1143 Type 2 diabetes mellitus with diabetic autonomic (poly)neuropathy: Secondary | ICD-10-CM | POA: Diagnosis not present

## 2020-03-26 DIAGNOSIS — I10 Essential (primary) hypertension: Secondary | ICD-10-CM | POA: Diagnosis not present

## 2020-03-26 DIAGNOSIS — G600 Hereditary motor and sensory neuropathy: Secondary | ICD-10-CM | POA: Diagnosis not present

## 2020-03-26 DIAGNOSIS — Z6841 Body Mass Index (BMI) 40.0 and over, adult: Secondary | ICD-10-CM | POA: Diagnosis not present

## 2020-03-26 DIAGNOSIS — E038 Other specified hypothyroidism: Secondary | ICD-10-CM | POA: Diagnosis not present

## 2020-03-28 DIAGNOSIS — M19011 Primary osteoarthritis, right shoulder: Secondary | ICD-10-CM | POA: Diagnosis not present

## 2020-03-30 DIAGNOSIS — M9903 Segmental and somatic dysfunction of lumbar region: Secondary | ICD-10-CM | POA: Diagnosis not present

## 2020-03-30 DIAGNOSIS — M545 Low back pain: Secondary | ICD-10-CM | POA: Diagnosis not present

## 2020-03-30 DIAGNOSIS — M9902 Segmental and somatic dysfunction of thoracic region: Secondary | ICD-10-CM | POA: Diagnosis not present

## 2020-03-30 DIAGNOSIS — M9901 Segmental and somatic dysfunction of cervical region: Secondary | ICD-10-CM | POA: Diagnosis not present

## 2020-03-30 DIAGNOSIS — M546 Pain in thoracic spine: Secondary | ICD-10-CM | POA: Diagnosis not present

## 2020-03-30 DIAGNOSIS — M542 Cervicalgia: Secondary | ICD-10-CM | POA: Diagnosis not present

## 2020-04-20 DIAGNOSIS — M546 Pain in thoracic spine: Secondary | ICD-10-CM | POA: Diagnosis not present

## 2020-04-20 DIAGNOSIS — M9902 Segmental and somatic dysfunction of thoracic region: Secondary | ICD-10-CM | POA: Diagnosis not present

## 2020-04-20 DIAGNOSIS — M9903 Segmental and somatic dysfunction of lumbar region: Secondary | ICD-10-CM | POA: Diagnosis not present

## 2020-04-20 DIAGNOSIS — M9901 Segmental and somatic dysfunction of cervical region: Secondary | ICD-10-CM | POA: Diagnosis not present

## 2020-04-20 DIAGNOSIS — M542 Cervicalgia: Secondary | ICD-10-CM | POA: Diagnosis not present

## 2020-04-20 DIAGNOSIS — M545 Low back pain: Secondary | ICD-10-CM | POA: Diagnosis not present

## 2020-05-04 DIAGNOSIS — M9903 Segmental and somatic dysfunction of lumbar region: Secondary | ICD-10-CM | POA: Diagnosis not present

## 2020-05-04 DIAGNOSIS — M9901 Segmental and somatic dysfunction of cervical region: Secondary | ICD-10-CM | POA: Diagnosis not present

## 2020-05-04 DIAGNOSIS — M9902 Segmental and somatic dysfunction of thoracic region: Secondary | ICD-10-CM | POA: Diagnosis not present

## 2020-05-04 DIAGNOSIS — M545 Low back pain: Secondary | ICD-10-CM | POA: Diagnosis not present

## 2020-05-04 DIAGNOSIS — M542 Cervicalgia: Secondary | ICD-10-CM | POA: Diagnosis not present

## 2020-05-04 DIAGNOSIS — M546 Pain in thoracic spine: Secondary | ICD-10-CM | POA: Diagnosis not present

## 2020-05-11 ENCOUNTER — Other Ambulatory Visit: Payer: Self-pay | Admitting: Internal Medicine

## 2020-05-11 DIAGNOSIS — Z1231 Encounter for screening mammogram for malignant neoplasm of breast: Secondary | ICD-10-CM

## 2020-05-14 DIAGNOSIS — M9901 Segmental and somatic dysfunction of cervical region: Secondary | ICD-10-CM | POA: Diagnosis not present

## 2020-05-14 DIAGNOSIS — M542 Cervicalgia: Secondary | ICD-10-CM | POA: Diagnosis not present

## 2020-05-14 DIAGNOSIS — M9903 Segmental and somatic dysfunction of lumbar region: Secondary | ICD-10-CM | POA: Diagnosis not present

## 2020-05-14 DIAGNOSIS — M546 Pain in thoracic spine: Secondary | ICD-10-CM | POA: Diagnosis not present

## 2020-05-14 DIAGNOSIS — M9902 Segmental and somatic dysfunction of thoracic region: Secondary | ICD-10-CM | POA: Diagnosis not present

## 2020-05-28 DIAGNOSIS — M546 Pain in thoracic spine: Secondary | ICD-10-CM | POA: Diagnosis not present

## 2020-05-28 DIAGNOSIS — M9902 Segmental and somatic dysfunction of thoracic region: Secondary | ICD-10-CM | POA: Diagnosis not present

## 2020-05-28 DIAGNOSIS — M9901 Segmental and somatic dysfunction of cervical region: Secondary | ICD-10-CM | POA: Diagnosis not present

## 2020-05-28 DIAGNOSIS — M9903 Segmental and somatic dysfunction of lumbar region: Secondary | ICD-10-CM | POA: Diagnosis not present

## 2020-05-28 DIAGNOSIS — M542 Cervicalgia: Secondary | ICD-10-CM | POA: Diagnosis not present

## 2020-05-30 DIAGNOSIS — M170 Bilateral primary osteoarthritis of knee: Secondary | ICD-10-CM | POA: Diagnosis not present

## 2020-06-01 DIAGNOSIS — M7061 Trochanteric bursitis, right hip: Secondary | ICD-10-CM | POA: Diagnosis not present

## 2020-06-15 ENCOUNTER — Other Ambulatory Visit: Payer: Self-pay

## 2020-06-15 ENCOUNTER — Ambulatory Visit
Admission: RE | Admit: 2020-06-15 | Discharge: 2020-06-15 | Disposition: A | Discharge status: Home / Self Care | Payer: Medicare HMO | Source: Ambulatory Visit | Attending: Internal Medicine | Admitting: Internal Medicine

## 2020-06-15 DIAGNOSIS — M542 Cervicalgia: Secondary | ICD-10-CM | POA: Diagnosis not present

## 2020-06-15 DIAGNOSIS — M9903 Segmental and somatic dysfunction of lumbar region: Secondary | ICD-10-CM | POA: Diagnosis not present

## 2020-06-15 DIAGNOSIS — M9902 Segmental and somatic dysfunction of thoracic region: Secondary | ICD-10-CM | POA: Diagnosis not present

## 2020-06-15 DIAGNOSIS — Z1231 Encounter for screening mammogram for malignant neoplasm of breast: Secondary | ICD-10-CM

## 2020-06-15 DIAGNOSIS — M9901 Segmental and somatic dysfunction of cervical region: Secondary | ICD-10-CM | POA: Diagnosis not present

## 2020-06-15 DIAGNOSIS — M546 Pain in thoracic spine: Secondary | ICD-10-CM | POA: Diagnosis not present

## 2020-06-27 DIAGNOSIS — I10 Essential (primary) hypertension: Secondary | ICD-10-CM | POA: Diagnosis not present

## 2020-06-27 DIAGNOSIS — E1143 Type 2 diabetes mellitus with diabetic autonomic (poly)neuropathy: Secondary | ICD-10-CM | POA: Diagnosis not present

## 2020-06-27 DIAGNOSIS — J4531 Mild persistent asthma with (acute) exacerbation: Secondary | ICD-10-CM | POA: Diagnosis not present

## 2020-06-27 DIAGNOSIS — G600 Hereditary motor and sensory neuropathy: Secondary | ICD-10-CM | POA: Diagnosis not present

## 2020-06-27 DIAGNOSIS — Z0000 Encounter for general adult medical examination without abnormal findings: Secondary | ICD-10-CM | POA: Diagnosis not present

## 2020-06-27 DIAGNOSIS — E038 Other specified hypothyroidism: Secondary | ICD-10-CM | POA: Diagnosis not present

## 2020-06-27 DIAGNOSIS — Z6841 Body Mass Index (BMI) 40.0 and over, adult: Secondary | ICD-10-CM | POA: Diagnosis not present

## 2020-07-02 DIAGNOSIS — M542 Cervicalgia: Secondary | ICD-10-CM | POA: Diagnosis not present

## 2020-07-02 DIAGNOSIS — M9901 Segmental and somatic dysfunction of cervical region: Secondary | ICD-10-CM | POA: Diagnosis not present

## 2020-07-02 DIAGNOSIS — M9902 Segmental and somatic dysfunction of thoracic region: Secondary | ICD-10-CM | POA: Diagnosis not present

## 2020-07-02 DIAGNOSIS — M9903 Segmental and somatic dysfunction of lumbar region: Secondary | ICD-10-CM | POA: Diagnosis not present

## 2020-07-02 DIAGNOSIS — M546 Pain in thoracic spine: Secondary | ICD-10-CM | POA: Diagnosis not present

## 2020-07-13 DIAGNOSIS — M5416 Radiculopathy, lumbar region: Secondary | ICD-10-CM | POA: Diagnosis not present

## 2020-07-13 DIAGNOSIS — M5459 Other low back pain: Secondary | ICD-10-CM | POA: Diagnosis not present

## 2020-07-13 DIAGNOSIS — M7061 Trochanteric bursitis, right hip: Secondary | ICD-10-CM | POA: Diagnosis not present

## 2020-07-16 DIAGNOSIS — M9903 Segmental and somatic dysfunction of lumbar region: Secondary | ICD-10-CM | POA: Diagnosis not present

## 2020-07-16 DIAGNOSIS — M9901 Segmental and somatic dysfunction of cervical region: Secondary | ICD-10-CM | POA: Diagnosis not present

## 2020-07-16 DIAGNOSIS — M546 Pain in thoracic spine: Secondary | ICD-10-CM | POA: Diagnosis not present

## 2020-07-16 DIAGNOSIS — M9902 Segmental and somatic dysfunction of thoracic region: Secondary | ICD-10-CM | POA: Diagnosis not present

## 2020-07-16 DIAGNOSIS — M542 Cervicalgia: Secondary | ICD-10-CM | POA: Diagnosis not present

## 2020-07-25 DIAGNOSIS — M546 Pain in thoracic spine: Secondary | ICD-10-CM | POA: Diagnosis not present

## 2020-07-25 DIAGNOSIS — M542 Cervicalgia: Secondary | ICD-10-CM | POA: Diagnosis not present

## 2020-07-25 DIAGNOSIS — M9903 Segmental and somatic dysfunction of lumbar region: Secondary | ICD-10-CM | POA: Diagnosis not present

## 2020-07-25 DIAGNOSIS — M9901 Segmental and somatic dysfunction of cervical region: Secondary | ICD-10-CM | POA: Diagnosis not present

## 2020-07-25 DIAGNOSIS — M9902 Segmental and somatic dysfunction of thoracic region: Secondary | ICD-10-CM | POA: Diagnosis not present

## 2020-07-26 DIAGNOSIS — M4726 Other spondylosis with radiculopathy, lumbar region: Secondary | ICD-10-CM | POA: Diagnosis not present

## 2020-07-26 DIAGNOSIS — M9973 Connective tissue and disc stenosis of intervertebral foramina of lumbar region: Secondary | ICD-10-CM | POA: Diagnosis not present

## 2020-07-26 DIAGNOSIS — M47816 Spondylosis without myelopathy or radiculopathy, lumbar region: Secondary | ICD-10-CM | POA: Diagnosis not present

## 2020-07-30 DIAGNOSIS — M4727 Other spondylosis with radiculopathy, lumbosacral region: Secondary | ICD-10-CM | POA: Diagnosis not present

## 2020-08-08 DIAGNOSIS — M542 Cervicalgia: Secondary | ICD-10-CM | POA: Diagnosis not present

## 2020-08-08 DIAGNOSIS — M546 Pain in thoracic spine: Secondary | ICD-10-CM | POA: Diagnosis not present

## 2020-08-08 DIAGNOSIS — M9901 Segmental and somatic dysfunction of cervical region: Secondary | ICD-10-CM | POA: Diagnosis not present

## 2020-08-08 DIAGNOSIS — M9903 Segmental and somatic dysfunction of lumbar region: Secondary | ICD-10-CM | POA: Diagnosis not present

## 2020-08-08 DIAGNOSIS — M9902 Segmental and somatic dysfunction of thoracic region: Secondary | ICD-10-CM | POA: Diagnosis not present

## 2020-08-16 DIAGNOSIS — M549 Dorsalgia, unspecified: Secondary | ICD-10-CM | POA: Diagnosis not present

## 2020-08-16 DIAGNOSIS — M7061 Trochanteric bursitis, right hip: Secondary | ICD-10-CM | POA: Diagnosis not present

## 2020-08-16 DIAGNOSIS — G8929 Other chronic pain: Secondary | ICD-10-CM | POA: Diagnosis not present

## 2020-08-16 DIAGNOSIS — M5412 Radiculopathy, cervical region: Secondary | ICD-10-CM | POA: Diagnosis not present

## 2020-08-16 DIAGNOSIS — M5416 Radiculopathy, lumbar region: Secondary | ICD-10-CM | POA: Diagnosis not present

## 2020-08-24 DIAGNOSIS — M9902 Segmental and somatic dysfunction of thoracic region: Secondary | ICD-10-CM | POA: Diagnosis not present

## 2020-08-24 DIAGNOSIS — M546 Pain in thoracic spine: Secondary | ICD-10-CM | POA: Diagnosis not present

## 2020-08-24 DIAGNOSIS — M9901 Segmental and somatic dysfunction of cervical region: Secondary | ICD-10-CM | POA: Diagnosis not present

## 2020-08-24 DIAGNOSIS — M542 Cervicalgia: Secondary | ICD-10-CM | POA: Diagnosis not present

## 2020-08-24 DIAGNOSIS — M9903 Segmental and somatic dysfunction of lumbar region: Secondary | ICD-10-CM | POA: Diagnosis not present

## 2020-08-30 DIAGNOSIS — M170 Bilateral primary osteoarthritis of knee: Secondary | ICD-10-CM | POA: Diagnosis not present

## 2020-09-03 DIAGNOSIS — M5416 Radiculopathy, lumbar region: Secondary | ICD-10-CM | POA: Diagnosis not present

## 2020-09-03 DIAGNOSIS — E119 Type 2 diabetes mellitus without complications: Secondary | ICD-10-CM | POA: Diagnosis not present

## 2020-09-03 DIAGNOSIS — Z794 Long term (current) use of insulin: Secondary | ICD-10-CM | POA: Diagnosis not present

## 2020-09-04 DIAGNOSIS — H35033 Hypertensive retinopathy, bilateral: Secondary | ICD-10-CM | POA: Diagnosis not present

## 2020-09-04 DIAGNOSIS — H524 Presbyopia: Secondary | ICD-10-CM | POA: Diagnosis not present

## 2020-09-14 DIAGNOSIS — M4722 Other spondylosis with radiculopathy, cervical region: Secondary | ICD-10-CM | POA: Diagnosis not present

## 2020-09-14 DIAGNOSIS — M50223 Other cervical disc displacement at C6-C7 level: Secondary | ICD-10-CM | POA: Diagnosis not present

## 2020-09-14 DIAGNOSIS — M47812 Spondylosis without myelopathy or radiculopathy, cervical region: Secondary | ICD-10-CM | POA: Diagnosis not present

## 2020-09-14 DIAGNOSIS — M5412 Radiculopathy, cervical region: Secondary | ICD-10-CM | POA: Diagnosis not present

## 2020-09-14 DIAGNOSIS — M4802 Spinal stenosis, cervical region: Secondary | ICD-10-CM | POA: Diagnosis not present

## 2020-09-19 DIAGNOSIS — M9903 Segmental and somatic dysfunction of lumbar region: Secondary | ICD-10-CM | POA: Diagnosis not present

## 2020-09-19 DIAGNOSIS — M546 Pain in thoracic spine: Secondary | ICD-10-CM | POA: Diagnosis not present

## 2020-09-19 DIAGNOSIS — M9901 Segmental and somatic dysfunction of cervical region: Secondary | ICD-10-CM | POA: Diagnosis not present

## 2020-09-19 DIAGNOSIS — M62830 Muscle spasm of back: Secondary | ICD-10-CM | POA: Diagnosis not present

## 2020-09-19 DIAGNOSIS — M542 Cervicalgia: Secondary | ICD-10-CM | POA: Diagnosis not present

## 2020-09-19 DIAGNOSIS — M9902 Segmental and somatic dysfunction of thoracic region: Secondary | ICD-10-CM | POA: Diagnosis not present

## 2020-09-25 DIAGNOSIS — M792 Neuralgia and neuritis, unspecified: Secondary | ICD-10-CM | POA: Diagnosis not present

## 2020-09-25 DIAGNOSIS — M461 Sacroiliitis, not elsewhere classified: Secondary | ICD-10-CM | POA: Diagnosis not present

## 2020-09-25 DIAGNOSIS — M47816 Spondylosis without myelopathy or radiculopathy, lumbar region: Secondary | ICD-10-CM | POA: Diagnosis not present

## 2020-09-25 DIAGNOSIS — G8929 Other chronic pain: Secondary | ICD-10-CM | POA: Diagnosis not present

## 2020-09-27 DIAGNOSIS — I10 Essential (primary) hypertension: Secondary | ICD-10-CM | POA: Diagnosis not present

## 2020-09-27 DIAGNOSIS — G600 Hereditary motor and sensory neuropathy: Secondary | ICD-10-CM | POA: Diagnosis not present

## 2020-09-27 DIAGNOSIS — E038 Other specified hypothyroidism: Secondary | ICD-10-CM | POA: Diagnosis not present

## 2020-09-27 DIAGNOSIS — Z6841 Body Mass Index (BMI) 40.0 and over, adult: Secondary | ICD-10-CM | POA: Diagnosis not present

## 2020-09-27 DIAGNOSIS — Z0000 Encounter for general adult medical examination without abnormal findings: Secondary | ICD-10-CM | POA: Diagnosis not present

## 2020-09-27 DIAGNOSIS — E1143 Type 2 diabetes mellitus with diabetic autonomic (poly)neuropathy: Secondary | ICD-10-CM | POA: Diagnosis not present

## 2020-09-27 DIAGNOSIS — J4531 Mild persistent asthma with (acute) exacerbation: Secondary | ICD-10-CM | POA: Diagnosis not present

## 2020-10-01 DIAGNOSIS — M542 Cervicalgia: Secondary | ICD-10-CM | POA: Diagnosis not present

## 2020-10-01 DIAGNOSIS — M62830 Muscle spasm of back: Secondary | ICD-10-CM | POA: Diagnosis not present

## 2020-10-01 DIAGNOSIS — M9901 Segmental and somatic dysfunction of cervical region: Secondary | ICD-10-CM | POA: Diagnosis not present

## 2020-10-01 DIAGNOSIS — M9903 Segmental and somatic dysfunction of lumbar region: Secondary | ICD-10-CM | POA: Diagnosis not present

## 2020-10-01 DIAGNOSIS — M9902 Segmental and somatic dysfunction of thoracic region: Secondary | ICD-10-CM | POA: Diagnosis not present

## 2020-10-01 DIAGNOSIS — M546 Pain in thoracic spine: Secondary | ICD-10-CM | POA: Diagnosis not present

## 2020-10-12 DIAGNOSIS — M542 Cervicalgia: Secondary | ICD-10-CM | POA: Diagnosis not present

## 2020-10-12 DIAGNOSIS — M9903 Segmental and somatic dysfunction of lumbar region: Secondary | ICD-10-CM | POA: Diagnosis not present

## 2020-10-12 DIAGNOSIS — M9901 Segmental and somatic dysfunction of cervical region: Secondary | ICD-10-CM | POA: Diagnosis not present

## 2020-10-12 DIAGNOSIS — M9902 Segmental and somatic dysfunction of thoracic region: Secondary | ICD-10-CM | POA: Diagnosis not present

## 2020-10-12 DIAGNOSIS — M546 Pain in thoracic spine: Secondary | ICD-10-CM | POA: Diagnosis not present

## 2020-10-12 DIAGNOSIS — M62830 Muscle spasm of back: Secondary | ICD-10-CM | POA: Diagnosis not present

## 2020-10-26 DIAGNOSIS — M9902 Segmental and somatic dysfunction of thoracic region: Secondary | ICD-10-CM | POA: Diagnosis not present

## 2020-10-26 DIAGNOSIS — M62830 Muscle spasm of back: Secondary | ICD-10-CM | POA: Diagnosis not present

## 2020-10-26 DIAGNOSIS — M9903 Segmental and somatic dysfunction of lumbar region: Secondary | ICD-10-CM | POA: Diagnosis not present

## 2020-10-26 DIAGNOSIS — M9901 Segmental and somatic dysfunction of cervical region: Secondary | ICD-10-CM | POA: Diagnosis not present

## 2020-10-26 DIAGNOSIS — M542 Cervicalgia: Secondary | ICD-10-CM | POA: Diagnosis not present

## 2020-10-26 DIAGNOSIS — M546 Pain in thoracic spine: Secondary | ICD-10-CM | POA: Diagnosis not present

## 2020-11-12 DIAGNOSIS — E1141 Type 2 diabetes mellitus with diabetic mononeuropathy: Secondary | ICD-10-CM | POA: Diagnosis not present

## 2020-11-12 DIAGNOSIS — Z794 Long term (current) use of insulin: Secondary | ICD-10-CM | POA: Diagnosis not present

## 2020-11-13 DIAGNOSIS — Z794 Long term (current) use of insulin: Secondary | ICD-10-CM | POA: Diagnosis not present

## 2020-11-13 DIAGNOSIS — M79605 Pain in left leg: Secondary | ICD-10-CM | POA: Diagnosis not present

## 2020-11-13 DIAGNOSIS — M79604 Pain in right leg: Secondary | ICD-10-CM | POA: Diagnosis not present

## 2020-11-13 DIAGNOSIS — E1141 Type 2 diabetes mellitus with diabetic mononeuropathy: Secondary | ICD-10-CM | POA: Diagnosis not present

## 2020-11-14 DIAGNOSIS — M9903 Segmental and somatic dysfunction of lumbar region: Secondary | ICD-10-CM | POA: Diagnosis not present

## 2020-11-14 DIAGNOSIS — M9902 Segmental and somatic dysfunction of thoracic region: Secondary | ICD-10-CM | POA: Diagnosis not present

## 2020-11-14 DIAGNOSIS — M62830 Muscle spasm of back: Secondary | ICD-10-CM | POA: Diagnosis not present

## 2020-11-14 DIAGNOSIS — M542 Cervicalgia: Secondary | ICD-10-CM | POA: Diagnosis not present

## 2020-11-14 DIAGNOSIS — M9901 Segmental and somatic dysfunction of cervical region: Secondary | ICD-10-CM | POA: Diagnosis not present

## 2020-11-14 DIAGNOSIS — M546 Pain in thoracic spine: Secondary | ICD-10-CM | POA: Diagnosis not present

## 2020-11-21 DIAGNOSIS — M170 Bilateral primary osteoarthritis of knee: Secondary | ICD-10-CM | POA: Diagnosis not present

## 2020-12-05 DIAGNOSIS — M62830 Muscle spasm of back: Secondary | ICD-10-CM | POA: Diagnosis not present

## 2020-12-05 DIAGNOSIS — M9901 Segmental and somatic dysfunction of cervical region: Secondary | ICD-10-CM | POA: Diagnosis not present

## 2020-12-05 DIAGNOSIS — M542 Cervicalgia: Secondary | ICD-10-CM | POA: Diagnosis not present

## 2020-12-05 DIAGNOSIS — M9903 Segmental and somatic dysfunction of lumbar region: Secondary | ICD-10-CM | POA: Diagnosis not present

## 2020-12-05 DIAGNOSIS — M9902 Segmental and somatic dysfunction of thoracic region: Secondary | ICD-10-CM | POA: Diagnosis not present

## 2020-12-05 DIAGNOSIS — M546 Pain in thoracic spine: Secondary | ICD-10-CM | POA: Diagnosis not present

## 2020-12-17 DIAGNOSIS — M62830 Muscle spasm of back: Secondary | ICD-10-CM | POA: Diagnosis not present

## 2020-12-17 DIAGNOSIS — M542 Cervicalgia: Secondary | ICD-10-CM | POA: Diagnosis not present

## 2020-12-17 DIAGNOSIS — M546 Pain in thoracic spine: Secondary | ICD-10-CM | POA: Diagnosis not present

## 2020-12-17 DIAGNOSIS — M9901 Segmental and somatic dysfunction of cervical region: Secondary | ICD-10-CM | POA: Diagnosis not present

## 2020-12-17 DIAGNOSIS — M9902 Segmental and somatic dysfunction of thoracic region: Secondary | ICD-10-CM | POA: Diagnosis not present

## 2020-12-17 DIAGNOSIS — M9903 Segmental and somatic dysfunction of lumbar region: Secondary | ICD-10-CM | POA: Diagnosis not present

## 2020-12-25 DIAGNOSIS — M7541 Impingement syndrome of right shoulder: Secondary | ICD-10-CM | POA: Diagnosis not present

## 2020-12-25 DIAGNOSIS — M19011 Primary osteoarthritis, right shoulder: Secondary | ICD-10-CM | POA: Diagnosis not present

## 2020-12-27 DIAGNOSIS — E1143 Type 2 diabetes mellitus with diabetic autonomic (poly)neuropathy: Secondary | ICD-10-CM | POA: Diagnosis not present

## 2020-12-27 DIAGNOSIS — G600 Hereditary motor and sensory neuropathy: Secondary | ICD-10-CM | POA: Diagnosis not present

## 2020-12-27 DIAGNOSIS — I10 Essential (primary) hypertension: Secondary | ICD-10-CM | POA: Diagnosis not present

## 2020-12-27 DIAGNOSIS — T466X5A Adverse effect of antihyperlipidemic and antiarteriosclerotic drugs, initial encounter: Secondary | ICD-10-CM | POA: Diagnosis not present

## 2020-12-27 DIAGNOSIS — Z6839 Body mass index (BMI) 39.0-39.9, adult: Secondary | ICD-10-CM | POA: Diagnosis not present

## 2020-12-27 DIAGNOSIS — J4531 Mild persistent asthma with (acute) exacerbation: Secondary | ICD-10-CM | POA: Diagnosis not present

## 2020-12-27 DIAGNOSIS — E038 Other specified hypothyroidism: Secondary | ICD-10-CM | POA: Diagnosis not present

## 2020-12-27 DIAGNOSIS — Z0000 Encounter for general adult medical examination without abnormal findings: Secondary | ICD-10-CM | POA: Diagnosis not present

## 2021-01-04 DIAGNOSIS — M9901 Segmental and somatic dysfunction of cervical region: Secondary | ICD-10-CM | POA: Diagnosis not present

## 2021-01-04 DIAGNOSIS — M9903 Segmental and somatic dysfunction of lumbar region: Secondary | ICD-10-CM | POA: Diagnosis not present

## 2021-01-04 DIAGNOSIS — M9902 Segmental and somatic dysfunction of thoracic region: Secondary | ICD-10-CM | POA: Diagnosis not present

## 2021-01-04 DIAGNOSIS — M542 Cervicalgia: Secondary | ICD-10-CM | POA: Diagnosis not present

## 2021-01-04 DIAGNOSIS — M62830 Muscle spasm of back: Secondary | ICD-10-CM | POA: Diagnosis not present

## 2021-01-04 DIAGNOSIS — M546 Pain in thoracic spine: Secondary | ICD-10-CM | POA: Diagnosis not present

## 2021-01-21 DIAGNOSIS — M9901 Segmental and somatic dysfunction of cervical region: Secondary | ICD-10-CM | POA: Diagnosis not present

## 2021-01-21 DIAGNOSIS — M62830 Muscle spasm of back: Secondary | ICD-10-CM | POA: Diagnosis not present

## 2021-01-21 DIAGNOSIS — M9903 Segmental and somatic dysfunction of lumbar region: Secondary | ICD-10-CM | POA: Diagnosis not present

## 2021-01-21 DIAGNOSIS — M546 Pain in thoracic spine: Secondary | ICD-10-CM | POA: Diagnosis not present

## 2021-01-21 DIAGNOSIS — M542 Cervicalgia: Secondary | ICD-10-CM | POA: Diagnosis not present

## 2021-01-21 DIAGNOSIS — M9902 Segmental and somatic dysfunction of thoracic region: Secondary | ICD-10-CM | POA: Diagnosis not present

## 2021-02-07 DIAGNOSIS — E038 Other specified hypothyroidism: Secondary | ICD-10-CM | POA: Diagnosis not present

## 2021-02-07 DIAGNOSIS — I10 Essential (primary) hypertension: Secondary | ICD-10-CM | POA: Diagnosis not present

## 2021-02-07 DIAGNOSIS — E1143 Type 2 diabetes mellitus with diabetic autonomic (poly)neuropathy: Secondary | ICD-10-CM | POA: Diagnosis not present

## 2021-02-12 DIAGNOSIS — M546 Pain in thoracic spine: Secondary | ICD-10-CM | POA: Diagnosis not present

## 2021-02-12 DIAGNOSIS — M9903 Segmental and somatic dysfunction of lumbar region: Secondary | ICD-10-CM | POA: Diagnosis not present

## 2021-02-12 DIAGNOSIS — M62830 Muscle spasm of back: Secondary | ICD-10-CM | POA: Diagnosis not present

## 2021-02-12 DIAGNOSIS — M9901 Segmental and somatic dysfunction of cervical region: Secondary | ICD-10-CM | POA: Diagnosis not present

## 2021-02-12 DIAGNOSIS — M542 Cervicalgia: Secondary | ICD-10-CM | POA: Diagnosis not present

## 2021-02-12 DIAGNOSIS — M9902 Segmental and somatic dysfunction of thoracic region: Secondary | ICD-10-CM | POA: Diagnosis not present

## 2021-02-20 DIAGNOSIS — M25651 Stiffness of right hip, not elsewhere classified: Secondary | ICD-10-CM | POA: Diagnosis not present

## 2021-02-20 DIAGNOSIS — R294 Clicking hip: Secondary | ICD-10-CM | POA: Diagnosis not present

## 2021-02-20 DIAGNOSIS — M170 Bilateral primary osteoarthritis of knee: Secondary | ICD-10-CM | POA: Diagnosis not present

## 2021-02-20 DIAGNOSIS — M25551 Pain in right hip: Secondary | ICD-10-CM | POA: Diagnosis not present

## 2021-02-20 DIAGNOSIS — G8929 Other chronic pain: Secondary | ICD-10-CM | POA: Diagnosis not present

## 2021-03-07 DIAGNOSIS — G8929 Other chronic pain: Secondary | ICD-10-CM | POA: Diagnosis not present

## 2021-03-07 DIAGNOSIS — M25651 Stiffness of right hip, not elsewhere classified: Secondary | ICD-10-CM | POA: Diagnosis not present

## 2021-03-07 DIAGNOSIS — M1611 Unilateral primary osteoarthritis, right hip: Secondary | ICD-10-CM | POA: Diagnosis not present

## 2021-03-07 DIAGNOSIS — R294 Clicking hip: Secondary | ICD-10-CM | POA: Diagnosis not present

## 2021-03-07 DIAGNOSIS — M25551 Pain in right hip: Secondary | ICD-10-CM | POA: Diagnosis not present

## 2021-03-08 DIAGNOSIS — M62830 Muscle spasm of back: Secondary | ICD-10-CM | POA: Diagnosis not present

## 2021-03-08 DIAGNOSIS — M9901 Segmental and somatic dysfunction of cervical region: Secondary | ICD-10-CM | POA: Diagnosis not present

## 2021-03-08 DIAGNOSIS — M546 Pain in thoracic spine: Secondary | ICD-10-CM | POA: Diagnosis not present

## 2021-03-08 DIAGNOSIS — M9903 Segmental and somatic dysfunction of lumbar region: Secondary | ICD-10-CM | POA: Diagnosis not present

## 2021-03-08 DIAGNOSIS — M542 Cervicalgia: Secondary | ICD-10-CM | POA: Diagnosis not present

## 2021-03-08 DIAGNOSIS — M9902 Segmental and somatic dysfunction of thoracic region: Secondary | ICD-10-CM | POA: Diagnosis not present

## 2021-03-10 DIAGNOSIS — E1143 Type 2 diabetes mellitus with diabetic autonomic (poly)neuropathy: Secondary | ICD-10-CM | POA: Diagnosis not present

## 2021-03-10 DIAGNOSIS — E038 Other specified hypothyroidism: Secondary | ICD-10-CM | POA: Diagnosis not present

## 2021-03-10 DIAGNOSIS — J4531 Mild persistent asthma with (acute) exacerbation: Secondary | ICD-10-CM | POA: Diagnosis not present

## 2021-03-10 DIAGNOSIS — I10 Essential (primary) hypertension: Secondary | ICD-10-CM | POA: Diagnosis not present

## 2021-03-10 DIAGNOSIS — G600 Hereditary motor and sensory neuropathy: Secondary | ICD-10-CM | POA: Diagnosis not present

## 2021-03-12 DIAGNOSIS — M76891 Other specified enthesopathies of right lower limb, excluding foot: Secondary | ICD-10-CM | POA: Diagnosis not present

## 2021-03-12 DIAGNOSIS — M1611 Unilateral primary osteoarthritis, right hip: Secondary | ICD-10-CM | POA: Diagnosis not present

## 2021-03-12 DIAGNOSIS — M25551 Pain in right hip: Secondary | ICD-10-CM | POA: Diagnosis not present

## 2021-03-12 DIAGNOSIS — G8929 Other chronic pain: Secondary | ICD-10-CM | POA: Diagnosis not present

## 2021-03-15 DIAGNOSIS — G8929 Other chronic pain: Secondary | ICD-10-CM | POA: Diagnosis not present

## 2021-03-15 DIAGNOSIS — M1611 Unilateral primary osteoarthritis, right hip: Secondary | ICD-10-CM | POA: Diagnosis not present

## 2021-03-15 DIAGNOSIS — M25551 Pain in right hip: Secondary | ICD-10-CM | POA: Diagnosis not present

## 2021-03-22 DIAGNOSIS — M542 Cervicalgia: Secondary | ICD-10-CM | POA: Diagnosis not present

## 2021-03-22 DIAGNOSIS — M9901 Segmental and somatic dysfunction of cervical region: Secondary | ICD-10-CM | POA: Diagnosis not present

## 2021-03-22 DIAGNOSIS — M62830 Muscle spasm of back: Secondary | ICD-10-CM | POA: Diagnosis not present

## 2021-03-22 DIAGNOSIS — M546 Pain in thoracic spine: Secondary | ICD-10-CM | POA: Diagnosis not present

## 2021-03-22 DIAGNOSIS — M9902 Segmental and somatic dysfunction of thoracic region: Secondary | ICD-10-CM | POA: Diagnosis not present

## 2021-03-22 DIAGNOSIS — M9903 Segmental and somatic dysfunction of lumbar region: Secondary | ICD-10-CM | POA: Diagnosis not present

## 2021-04-09 DIAGNOSIS — I7389 Other specified peripheral vascular diseases: Secondary | ICD-10-CM | POA: Diagnosis not present

## 2021-04-09 DIAGNOSIS — D649 Anemia, unspecified: Secondary | ICD-10-CM | POA: Diagnosis not present

## 2021-04-09 DIAGNOSIS — J4540 Moderate persistent asthma, uncomplicated: Secondary | ICD-10-CM | POA: Diagnosis not present

## 2021-04-09 DIAGNOSIS — Z0000 Encounter for general adult medical examination without abnormal findings: Secondary | ICD-10-CM | POA: Diagnosis not present

## 2021-04-09 DIAGNOSIS — G600 Hereditary motor and sensory neuropathy: Secondary | ICD-10-CM | POA: Diagnosis not present

## 2021-04-09 DIAGNOSIS — E1143 Type 2 diabetes mellitus with diabetic autonomic (poly)neuropathy: Secondary | ICD-10-CM | POA: Diagnosis not present

## 2021-04-09 DIAGNOSIS — E038 Other specified hypothyroidism: Secondary | ICD-10-CM | POA: Diagnosis not present

## 2021-04-09 DIAGNOSIS — D518 Other vitamin B12 deficiency anemias: Secondary | ICD-10-CM | POA: Diagnosis not present

## 2021-04-09 DIAGNOSIS — Z79899 Other long term (current) drug therapy: Secondary | ICD-10-CM | POA: Diagnosis not present

## 2021-04-09 DIAGNOSIS — Z6841 Body Mass Index (BMI) 40.0 and over, adult: Secondary | ICD-10-CM | POA: Diagnosis not present

## 2021-04-09 DIAGNOSIS — I10 Essential (primary) hypertension: Secondary | ICD-10-CM | POA: Diagnosis not present

## 2021-04-09 DIAGNOSIS — E7849 Other hyperlipidemia: Secondary | ICD-10-CM | POA: Diagnosis not present

## 2021-04-10 DIAGNOSIS — E038 Other specified hypothyroidism: Secondary | ICD-10-CM | POA: Diagnosis not present

## 2021-04-10 DIAGNOSIS — I7389 Other specified peripheral vascular diseases: Secondary | ICD-10-CM | POA: Diagnosis not present

## 2021-04-10 DIAGNOSIS — J4540 Moderate persistent asthma, uncomplicated: Secondary | ICD-10-CM | POA: Diagnosis not present

## 2021-04-10 DIAGNOSIS — I10 Essential (primary) hypertension: Secondary | ICD-10-CM | POA: Diagnosis not present

## 2021-04-10 DIAGNOSIS — E1143 Type 2 diabetes mellitus with diabetic autonomic (poly)neuropathy: Secondary | ICD-10-CM | POA: Diagnosis not present

## 2021-04-12 DIAGNOSIS — M542 Cervicalgia: Secondary | ICD-10-CM | POA: Diagnosis not present

## 2021-04-12 DIAGNOSIS — M9902 Segmental and somatic dysfunction of thoracic region: Secondary | ICD-10-CM | POA: Diagnosis not present

## 2021-04-12 DIAGNOSIS — M546 Pain in thoracic spine: Secondary | ICD-10-CM | POA: Diagnosis not present

## 2021-04-12 DIAGNOSIS — M62830 Muscle spasm of back: Secondary | ICD-10-CM | POA: Diagnosis not present

## 2021-04-12 DIAGNOSIS — M9901 Segmental and somatic dysfunction of cervical region: Secondary | ICD-10-CM | POA: Diagnosis not present

## 2021-04-12 DIAGNOSIS — M9903 Segmental and somatic dysfunction of lumbar region: Secondary | ICD-10-CM | POA: Diagnosis not present

## 2021-04-19 DIAGNOSIS — E1143 Type 2 diabetes mellitus with diabetic autonomic (poly)neuropathy: Secondary | ICD-10-CM | POA: Diagnosis not present

## 2021-04-27 DIAGNOSIS — G4730 Sleep apnea, unspecified: Secondary | ICD-10-CM | POA: Diagnosis not present

## 2021-05-01 DIAGNOSIS — M9902 Segmental and somatic dysfunction of thoracic region: Secondary | ICD-10-CM | POA: Diagnosis not present

## 2021-05-01 DIAGNOSIS — M9901 Segmental and somatic dysfunction of cervical region: Secondary | ICD-10-CM | POA: Diagnosis not present

## 2021-05-01 DIAGNOSIS — M9903 Segmental and somatic dysfunction of lumbar region: Secondary | ICD-10-CM | POA: Diagnosis not present

## 2021-05-01 DIAGNOSIS — M62830 Muscle spasm of back: Secondary | ICD-10-CM | POA: Diagnosis not present

## 2021-05-01 DIAGNOSIS — M546 Pain in thoracic spine: Secondary | ICD-10-CM | POA: Diagnosis not present

## 2021-05-01 DIAGNOSIS — M542 Cervicalgia: Secondary | ICD-10-CM | POA: Diagnosis not present

## 2021-05-08 ENCOUNTER — Encounter: Payer: Self-pay | Admitting: Vascular Surgery

## 2021-05-08 ENCOUNTER — Other Ambulatory Visit: Payer: Self-pay

## 2021-05-08 ENCOUNTER — Ambulatory Visit: Payer: Medicare HMO

## 2021-05-08 ENCOUNTER — Ambulatory Visit (INDEPENDENT_AMBULATORY_CARE_PROVIDER_SITE_OTHER): Payer: Medicare HMO | Admitting: Vascular Surgery

## 2021-05-08 ENCOUNTER — Other Ambulatory Visit: Payer: Self-pay | Admitting: Vascular Surgery

## 2021-05-08 VITALS — BP 149/89 | HR 73 | Temp 98.1°F | Resp 14 | Ht 67.0 in | Wt 254.0 lb

## 2021-05-08 DIAGNOSIS — M25561 Pain in right knee: Secondary | ICD-10-CM

## 2021-05-08 DIAGNOSIS — M25562 Pain in left knee: Secondary | ICD-10-CM

## 2021-05-08 DIAGNOSIS — I739 Peripheral vascular disease, unspecified: Secondary | ICD-10-CM

## 2021-05-08 NOTE — Progress Notes (Signed)
[facility] [place]  Patient name: Denise Daniels MRN: [id number] DOB: 06-08-1971 Sex: female  REASON FOR CONSULT: Evaluation bilateral lower extremity, rule out arterial insufficiency  HPI: Denise Daniels is a 50 y.o. female, who is here today for evaluation of lower extremity discomfort.  She is here today with her husband.  She has a very unusual symptom complex.  She does have clear-cut diabetic neuropathy and also has diagnosis of fibromyalgia.  She is having episodes where she develops pain throughout both lower extremities and has color changes where she has very red discoloration of her skin that can fluctuate and then become less red.  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  She has no history of DVT and no history of lower extremity arterial insufficiency.  Did have a normal noninvasive study in April 2022  Past Medical History:  Diagnosis Date   Diabetes mellitus without complication ([place]) [id number]   Fibromyalgia    Hypertension    Hypothyroidism    Thyroid disease     Family History  Problem Relation Age of Onset   Kidney disease Mother    Heart failure Mother    Diabetes Mother        Type I   Stroke Mother    Breast cancer Mother    Diabetes Father    Heart attack Father    Stroke Father    Aneurysm Paternal Aunt        Brain   Stroke Paternal Aunt    Cancer Paternal Uncle        lung   Aneurysm Paternal Aunt        brain   Stroke Paternal Aunt     SOCIAL HISTORY: Social History   Socioeconomic History   Marital status: Married    Spouse name: Not on file   Number of children: Not on file   Years of education: Not on file   Highest education level: Not on file  Occupational History   Not on file  Tobacco Use   Smoking status: Never   Smokeless tobacco: Never  Substance and  Sexual Activity   Alcohol use: No   Drug use: No   Sexual activity: Yes    Birth control/protection: Surgical  Other Topics Concern   Not on file  Social History Narrative   Not on file   Social Determinants of Health   Financial Resource Strain: Not on file  Food Insecurity: Not on file  Transportation Needs: Not on file  Physical Activity: Not on file  Stress: Not on file  Social Connections: Not on file  Intimate Partner Violence: Not on file    Allergies  Allergen Reactions   Statins Other (See Comments), Anaphylaxis and Shortness Of Breath    Irritable Irritable, fatigue. Has tried pravastatin, lovastatin Irritable  Irritable    Current Outpatient Medications  Medication Sig Dispense Refill   amoxicillin (AMOXIL) 250 MG capsule Take 250 mg by mouth 2 (two) times daily as needed (for skin).     amoxicillin (AMOXIL) 500 MG capsule Take 1 capsule (500 mg total) 3 (three) times daily by mouth. 21 capsule 0   atenolol (TENORMIN) 25 MG tablet Take 1 tablet (25 mg total) by mouth daily. 30 tablet 2   benzonatate (TESSALON) 100 MG capsule 1 - 2 po q 8 hour prn cough. [place]  capsule 3   carisoprodol (SOMA) 350 MG tablet Take 350 mg by mouth daily.     gabapentin (NEURONTIN) 300 MG capsule Take 300 mg by mouth at bedtime.     ibuprofen (ADVIL,MOTRIN) 200 MG tablet Take 200 mg by mouth 2 (two) times daily as needed.      Insulin Glargine (LANTUS) 100 UNIT/ML Solostar Pen Inject 10 Units into the skin at bedtime.     levothyroxine (SYNTHROID, LEVOTHROID) 150 MCG tablet TAKE 1 TABLET BY MOUTH ONCE DAILY 30 tablet 3   metFORMIN (GLUCOPHAGE) 500 MG tablet Take 500 mg 2 (two) times daily with a meal by mouth.     promethazine-dextromethorphan (PROMETHAZINE-DM) 6.25-15 MG/5ML syrup Take 5 mLs 4 (four) times daily as needed by mouth for cough. 118 mL 0   ranitidine (ZANTAC) 150 MG tablet Take 150 mg by mouth at bedtime.     sertraline (ZOLOFT) 100 MG tablet Take 100 mg by mouth daily.      traMADol (ULTRAM) 50 MG tablet Take 50 mg by mouth every 8 (eight) hours as needed for moderate pain (Fiber Myalgia Flare).      triamterene-hydrochlorothiazide (DYAZIDE) 37.5-25 MG capsule Take 1 capsule by mouth daily.     metFORMIN (GLUCOPHAGE) 1000 MG tablet Take 1 tablet (1,000 mg total) by mouth 2 (two) times daily with a meal. (Patient not taking: No sig reported) 60 tablet 4   No current facility-administered medications for this visit.    REVIEW OF SYSTEMS:  [X]  denotes positive finding, [ ]  denotes negative finding Cardiac  Comments:  Chest pain or chest pressure:    Shortness of breath upon exertion: x   Short of breath when lying flat:    Irregular heart rhythm: x       Vascular    Pain in calf, thigh, or hip brought on by ambulation: x   Pain in feet at night that wakes you up from your sleep:  x   Blood clot in your veins:    Leg swelling:  x       Pulmonary    Oxygen at home:    Productive cough:     Wheezing:         Neurologic    Sudden weakness in arms or legs:     Sudden numbness in arms or legs:  x   Sudden onset of difficulty speaking or slurred speech:    Temporary loss of vision in one eye:     Problems with dizziness:         Gastrointestinal    Blood in stool:     Vomited blood:         Genitourinary    Burning when urinating:     Blood in urine:        Psychiatric    Major depression:         Hematologic    Bleeding problems:    Problems with blood clotting too easily:        Skin    Rashes or ulcers: x       Constitutional    Fever or chills:      PHYSICAL EXAM: Vitals:   05/08/21 1458  BP: (!) 149/89  Pulse: 73  Resp: 14  Temp: 98.1 F (36.7 C)  TempSrc: Temporal  SpO2: 94%  Weight: 254 lb (115.2 kg)  Height: 5\' 7"  (1.702 m)    GENERAL: The patient is a well-nourished female, in no acute distress. The vital signs are  documented above. CARDIOVASCULAR: 2+ radial, 2+ dorsalis pedis, 2+ posterior tibial pulses  bilaterally PULMONARY: There is good air exchange  MUSCULOSKELETAL: There are no major deformities or cyanosis. NEUROLOGIC: No focal weakness or paresthesias are detected. SKIN: There are no ulcers or rashes noted. PSYCHIATRIC: The patient has a normal affect.  DATA:  Noninvasive studies at [facility] from 11/13/2020 were reviewed.  This revealed normal ankle arm index bilaterally and triphasic waveforms at the posterior tibial level bilaterally  MEDICAL ISSUES: Had long discussion with the patient and her husband.  I do not see any evidence of arterial insufficiency as a cause for her unusual symptom complex.  I suggested possible dermatologic evaluation.  She has seen rheumatology already with no clear-cut cause.  She was reassured that this is not arterial and will see [place] again on an as-needed basis   Rosetta Posner, MD [facility] [phone] Pager [phone]  Note: Portions of this report may have been transcribed using voice recognition software.  Every effort has been made to ensure accuracy; however, inadvertent computerized transcription errors may still be present.

## 2021-05-10 DIAGNOSIS — E1143 Type 2 diabetes mellitus with diabetic autonomic (poly)neuropathy: Secondary | ICD-10-CM | POA: Diagnosis not present

## 2021-05-10 DIAGNOSIS — I10 Essential (primary) hypertension: Secondary | ICD-10-CM | POA: Diagnosis not present

## 2021-05-10 DIAGNOSIS — E038 Other specified hypothyroidism: Secondary | ICD-10-CM | POA: Diagnosis not present

## 2021-05-13 ENCOUNTER — Other Ambulatory Visit: Payer: Self-pay | Admitting: Internal Medicine

## 2021-05-13 DIAGNOSIS — M542 Cervicalgia: Secondary | ICD-10-CM | POA: Diagnosis not present

## 2021-05-13 DIAGNOSIS — Z1231 Encounter for screening mammogram for malignant neoplasm of breast: Secondary | ICD-10-CM

## 2021-05-13 DIAGNOSIS — M546 Pain in thoracic spine: Secondary | ICD-10-CM | POA: Diagnosis not present

## 2021-05-13 DIAGNOSIS — M9903 Segmental and somatic dysfunction of lumbar region: Secondary | ICD-10-CM | POA: Diagnosis not present

## 2021-05-13 DIAGNOSIS — M62830 Muscle spasm of back: Secondary | ICD-10-CM | POA: Diagnosis not present

## 2021-05-13 DIAGNOSIS — M9901 Segmental and somatic dysfunction of cervical region: Secondary | ICD-10-CM | POA: Diagnosis not present

## 2021-05-13 DIAGNOSIS — M9902 Segmental and somatic dysfunction of thoracic region: Secondary | ICD-10-CM | POA: Diagnosis not present

## 2021-05-15 ENCOUNTER — Encounter (INDEPENDENT_AMBULATORY_CARE_PROVIDER_SITE_OTHER): Payer: Self-pay | Admitting: *Deleted

## 2021-05-24 DIAGNOSIS — M542 Cervicalgia: Secondary | ICD-10-CM | POA: Diagnosis not present

## 2021-05-24 DIAGNOSIS — M9902 Segmental and somatic dysfunction of thoracic region: Secondary | ICD-10-CM | POA: Diagnosis not present

## 2021-05-24 DIAGNOSIS — M9903 Segmental and somatic dysfunction of lumbar region: Secondary | ICD-10-CM | POA: Diagnosis not present

## 2021-05-24 DIAGNOSIS — M546 Pain in thoracic spine: Secondary | ICD-10-CM | POA: Diagnosis not present

## 2021-05-24 DIAGNOSIS — M62830 Muscle spasm of back: Secondary | ICD-10-CM | POA: Diagnosis not present

## 2021-05-24 DIAGNOSIS — M9901 Segmental and somatic dysfunction of cervical region: Secondary | ICD-10-CM | POA: Diagnosis not present

## 2021-06-10 DIAGNOSIS — I10 Essential (primary) hypertension: Secondary | ICD-10-CM | POA: Diagnosis not present

## 2021-06-10 DIAGNOSIS — E038 Other specified hypothyroidism: Secondary | ICD-10-CM | POA: Diagnosis not present

## 2021-06-10 DIAGNOSIS — M9903 Segmental and somatic dysfunction of lumbar region: Secondary | ICD-10-CM | POA: Diagnosis not present

## 2021-06-10 DIAGNOSIS — M546 Pain in thoracic spine: Secondary | ICD-10-CM | POA: Diagnosis not present

## 2021-06-10 DIAGNOSIS — E1143 Type 2 diabetes mellitus with diabetic autonomic (poly)neuropathy: Secondary | ICD-10-CM | POA: Diagnosis not present

## 2021-06-10 DIAGNOSIS — M62830 Muscle spasm of back: Secondary | ICD-10-CM | POA: Diagnosis not present

## 2021-06-10 DIAGNOSIS — M9902 Segmental and somatic dysfunction of thoracic region: Secondary | ICD-10-CM | POA: Diagnosis not present

## 2021-06-10 DIAGNOSIS — M9901 Segmental and somatic dysfunction of cervical region: Secondary | ICD-10-CM | POA: Diagnosis not present

## 2021-06-10 DIAGNOSIS — M542 Cervicalgia: Secondary | ICD-10-CM | POA: Diagnosis not present

## 2021-06-17 ENCOUNTER — Other Ambulatory Visit: Payer: Self-pay

## 2021-06-17 ENCOUNTER — Ambulatory Visit
Admission: RE | Admit: 2021-06-17 | Discharge: 2021-06-17 | Disposition: A | Discharge status: Home / Self Care | Payer: Medicare HMO | Source: Ambulatory Visit | Attending: Internal Medicine | Admitting: Internal Medicine

## 2021-06-17 DIAGNOSIS — Z1231 Encounter for screening mammogram for malignant neoplasm of breast: Secondary | ICD-10-CM

## 2021-06-28 DIAGNOSIS — M546 Pain in thoracic spine: Secondary | ICD-10-CM | POA: Diagnosis not present

## 2021-06-28 DIAGNOSIS — M9903 Segmental and somatic dysfunction of lumbar region: Secondary | ICD-10-CM | POA: Diagnosis not present

## 2021-06-28 DIAGNOSIS — M542 Cervicalgia: Secondary | ICD-10-CM | POA: Diagnosis not present

## 2021-06-28 DIAGNOSIS — M9901 Segmental and somatic dysfunction of cervical region: Secondary | ICD-10-CM | POA: Diagnosis not present

## 2021-06-28 DIAGNOSIS — M9902 Segmental and somatic dysfunction of thoracic region: Secondary | ICD-10-CM | POA: Diagnosis not present

## 2021-06-28 DIAGNOSIS — M62830 Muscle spasm of back: Secondary | ICD-10-CM | POA: Diagnosis not present

## 2021-07-03 ENCOUNTER — Encounter (INDEPENDENT_AMBULATORY_CARE_PROVIDER_SITE_OTHER): Payer: Self-pay

## 2021-07-03 ENCOUNTER — Other Ambulatory Visit (INDEPENDENT_AMBULATORY_CARE_PROVIDER_SITE_OTHER): Payer: Self-pay

## 2021-07-03 ENCOUNTER — Telehealth (INDEPENDENT_AMBULATORY_CARE_PROVIDER_SITE_OTHER): Payer: Self-pay

## 2021-07-03 DIAGNOSIS — Z1211 Encounter for screening for malignant neoplasm of colon: Secondary | ICD-10-CM

## 2021-07-03 MED ORDER — PEG 3350-KCL-NA BICARB-NACL 420 G PO SOLR: 4000.0000 mL | ORAL | 0 refills | Status: DC

## 2021-07-03 NOTE — Telephone Encounter (Signed)
Referring MD/PCP: Hasanaj  Procedure: tcs  Reason/Indication:  screening   Has patient had this procedure before?  no  If so, when, by whom and where?    Is there a family history of colon cancer?  no  Who?  What age when diagnosed?    Is patient diabetic? If yes, Type 1 or Type 2   yes, type 2      Does patient have prosthetic heart valve or mechanical valve?  no  Do you have a pacemaker/defibrillator?  no  Has patient ever had endocarditis/atrial fibrillation? no  Does patient use oxygen? no  Has patient had joint replacement within last 12 months?  no  Is patient constipated or do they take laxatives? no  Does patient have a history of alcohol/drug use?  no  Have you had a stroke/heart attack last 6 mths? no  Do you take medicine for weight loss?  no  For female patients,: do you still have your menstrual cycle? no  Is patient on blood thinner such as Coumadin, Plavix and/or Aspirin? no  Medications: tramadol 50 mg 1-2 tab daily, furosemide 40 mg daily, levothyroxine 137 mcg daily, metformin 500 mg daily, Vit B12 daily, gabapentin 300 mg tid, carisoprodol 350 daily, atenolol 25 mg daily, venlafaxine 150 mg daily, Advair 500/50 daily  Allergies: Statins  Medication Adjustment per Dr Jenetta Downer no metformin the evening prior or the morning of procedure  Procedure date & time: Wednesday 07/24/21 at 10:15 am

## 2021-07-03 NOTE — Telephone Encounter (Signed)
Ok to schedule.  Thanks,  Ladina Shutters Castaneda Mayorga, MD Gastroenterology and Hepatology [facility]  

## 2021-07-03 NOTE — Telephone Encounter (Signed)
LeighAnn Justus Droke, CMA  

## 2021-07-08 ENCOUNTER — Encounter (INDEPENDENT_AMBULATORY_CARE_PROVIDER_SITE_OTHER): Payer: Self-pay

## 2021-07-09 ENCOUNTER — Encounter (INDEPENDENT_AMBULATORY_CARE_PROVIDER_SITE_OTHER): Payer: Self-pay

## 2021-07-16 DIAGNOSIS — E038 Other specified hypothyroidism: Secondary | ICD-10-CM | POA: Diagnosis not present

## 2021-07-16 DIAGNOSIS — I10 Essential (primary) hypertension: Secondary | ICD-10-CM | POA: Diagnosis not present

## 2021-07-16 DIAGNOSIS — Z0000 Encounter for general adult medical examination without abnormal findings: Secondary | ICD-10-CM | POA: Diagnosis not present

## 2021-07-16 DIAGNOSIS — J4540 Moderate persistent asthma, uncomplicated: Secondary | ICD-10-CM | POA: Diagnosis not present

## 2021-07-16 DIAGNOSIS — E1143 Type 2 diabetes mellitus with diabetic autonomic (poly)neuropathy: Secondary | ICD-10-CM | POA: Diagnosis not present

## 2021-07-16 DIAGNOSIS — G4730 Sleep apnea, unspecified: Secondary | ICD-10-CM | POA: Diagnosis not present

## 2021-07-16 DIAGNOSIS — G600 Hereditary motor and sensory neuropathy: Secondary | ICD-10-CM | POA: Diagnosis not present

## 2021-07-16 DIAGNOSIS — Z6839 Body mass index (BMI) 39.0-39.9, adult: Secondary | ICD-10-CM | POA: Diagnosis not present

## 2021-07-16 DIAGNOSIS — I7389 Other specified peripheral vascular diseases: Secondary | ICD-10-CM | POA: Diagnosis not present

## 2021-07-18 NOTE — Patient Instructions (Signed)
Denise Daniels  07/18/2021     @PREFPERIOPPHARMACY @   Your procedure is scheduled on  07/24/2021.   Report to [facility] at  [facility]   Call this number if you have problems the morning of surgery:  [phone]   Remember:  Follow the diet and prep instructions given to you by the office.    Use your inhalers before you come and bring your rescue inhaler with you.    DO NOT take any medications for diabetes the morning of your procedure.    Take these medicines the morning of surgery with A SIP OF WATER         atenolol, soma or tramadol (if needed), gabapentin, levothyroxine, protonix, effexor.     Do not wear jewelry, make-up or nail polish.  Do not wear lotions, powders, or perfumes, or deodorant.  Do not shave 48 hours prior to surgery.  Men may shave face and neck.  Do not bring valuables to the hospital.  [facility] is not responsible for any belongings or valuables.  Contacts, dentures or bridgework may not be worn into surgery.  Leave your suitcase in the car.  After surgery it may be brought to your room.  For patients admitted to the hospital, discharge time will be determined by your treatment team.  Patients discharged the day of surgery will not be allowed to drive home and must have someone with them for 24 hours.    Special instructions:   DO NOT smoke tobacco or vape for 24 hours before your procedure.  Please read over the following fact sheets that you were given. Anesthesia Post-op Instructions and Care and Recovery After Surgery      Colonoscopy, Adult, Care After This sheet gives you information about how to care for yourself after your procedure. Your health care provider may also give you more specific instructions. If you have problems or questions, contact your health care provider. What can I expect after the procedure? After the procedure, it is common to have: A small amount of blood in your stool for 24 hours after the  procedure. Some gas. Mild cramping or bloating of your abdomen. Follow these instructions at home: Eating and drinking  Drink enough fluid to keep your urine pale yellow. Follow instructions from your health care provider about eating or drinking restrictions. Resume your normal diet as instructed by your health care provider. Avoid heavy or fried foods that are hard to digest. Activity Rest as told by your health care provider. Avoid sitting for a long time without moving. Get up to take short walks every 1-2 hours. This is important to improve blood flow and breathing. Ask for help if you feel weak or unsteady. Return to your normal activities as told by your health care provider. Ask your health care provider what activities are safe for you. Managing cramping and bloating  Try walking around when you have cramps or feel bloated. Apply heat to your abdomen as told by your health care provider. Use the heat source that your health care provider recommends, such as a moist heat pack or a heating pad. Place a towel between your skin and the heat source. Leave the heat on for 20-30 minutes. Remove the heat if your skin turns bright red. This is especially important if you are unable to feel pain, heat, or cold. You may have a greater risk of getting burned. General instructions If you were given a sedative  during the procedure, it can affect you for several hours. Do not drive or operate machinery until your health care provider says that it is safe. For the first 24 hours after the procedure: Do not sign important documents. Do not drink alcohol. Do your regular daily activities at a slower pace than normal. Eat soft foods that are easy to digest. Take over-the-counter and prescription medicines only as told by your health care provider. Keep all follow-up visits as told by your health care provider. This is important. Contact a health care provider if: You have blood in your stool 2-3  days after the procedure. Get help right away if you have: More than a small spotting of blood in your stool. Large blood clots in your stool. Swelling of your abdomen. Nausea or vomiting. A fever. Increasing pain in your abdomen that is not relieved with medicine. Summary After the procedure, it is common to have a small amount of blood in your stool. You may also have mild cramping and bloating of your abdomen. If you were given a sedative during the procedure, it can affect you for several hours. Do not drive or operate machinery until your health care provider says that it is safe. Get help right away if you have a lot of blood in your stool, nausea or vomiting, a fever, or increased pain in your abdomen. This information is not intended to replace advice given to you by your health care provider. Make sure you discuss any questions you have with your health care provider. Document Revised: 06/03/2019 Document Reviewed: 02/21/2019 Elsevier Patient Education  [place] After This sheet gives you information about how to care for yourself after your procedure. Your health care provider may also give you more specific instructions. If you have problems or questions, contact your health care provider. What can I expect after the procedure? After the procedure, it is common to have: Tiredness. Forgetfulness about what happened after the procedure. Impaired judgment for important decisions. Nausea or vomiting. Some difficulty with balance. Follow these instructions at home: For the time period you were told by your health care provider:   Rest as needed. Do not participate in activities where you could fall or become injured. Do not drive or use machinery. Do not drink alcohol. Do not take sleeping pills or medicines that cause drowsiness. Do not make important decisions or sign legal documents. Do not take care of children on your own. Eating  and drinking Follow the diet that is recommended by your health care provider. Drink enough fluid to keep your urine pale yellow. If you vomit: Drink water, juice, or soup when you can drink without vomiting. Make sure you have little or no nausea before eating solid foods. General instructions Have a responsible adult stay with you for the time you are told. It is important to have someone help care for you until you are awake and alert. Take over-the-counter and prescription medicines only as told by your health care provider. If you have sleep apnea, surgery and certain medicines can increase your risk for breathing problems. Follow instructions from your health care provider about wearing your sleep device: Anytime you are sleeping, including during daytime naps. While taking prescription pain medicines, sleeping medicines, or medicines that make you drowsy. Avoid smoking. Keep all follow-up visits as told by your health care provider. This is important. Contact a health care provider if: You keep feeling nauseous or you keep vomiting. You feel  light-headed. You are still sleepy or having trouble with balance after 24 hours. You develop a rash. You have a fever. You have redness or swelling around the IV site. Get help right away if: You have trouble breathing. You have new-onset confusion at home. Summary For several hours after your procedure, you may feel tired. You may also be forgetful and have poor judgment. Have a responsible adult stay with you for the time you are told. It is important to have someone help care for you until you are awake and alert. Rest as told. Do not drive or operate machinery. Do not drink alcohol or take sleeping pills. Get help right away if you have trouble breathing, or if you suddenly become confused. This information is not intended to replace advice given to you by your health care provider. Make sure you discuss any questions you have with your  health care provider. Document Revised: 04/12/2020 Document Reviewed: 06/30/2019 Elsevier Patient Education  2022 [employer].

## 2021-07-19 DIAGNOSIS — M9903 Segmental and somatic dysfunction of lumbar region: Secondary | ICD-10-CM | POA: Diagnosis not present

## 2021-07-19 DIAGNOSIS — M9902 Segmental and somatic dysfunction of thoracic region: Secondary | ICD-10-CM | POA: Diagnosis not present

## 2021-07-19 DIAGNOSIS — M9901 Segmental and somatic dysfunction of cervical region: Secondary | ICD-10-CM | POA: Diagnosis not present

## 2021-07-19 DIAGNOSIS — M62830 Muscle spasm of back: Secondary | ICD-10-CM | POA: Diagnosis not present

## 2021-07-19 DIAGNOSIS — M542 Cervicalgia: Secondary | ICD-10-CM | POA: Diagnosis not present

## 2021-07-19 DIAGNOSIS — M546 Pain in thoracic spine: Secondary | ICD-10-CM | POA: Diagnosis not present

## 2021-07-22 ENCOUNTER — Encounter (HOSPITAL_COMMUNITY): Payer: Self-pay

## 2021-07-22 ENCOUNTER — Encounter (HOSPITAL_COMMUNITY)
Admission: RE | Admit: 2021-07-22 | Discharge: 2021-07-22 | Disposition: A | Discharge status: Home / Self Care | Payer: Medicare HMO | Source: Ambulatory Visit | Attending: Gastroenterology | Admitting: Gastroenterology

## 2021-07-22 ENCOUNTER — Other Ambulatory Visit: Payer: Self-pay

## 2021-07-22 VITALS — BP 131/81 | HR 74 | Temp 97.8°F | Resp 18 | Ht 67.0 in | Wt 242.0 lb

## 2021-07-22 DIAGNOSIS — Z1211 Encounter for screening for malignant neoplasm of colon: Secondary | ICD-10-CM

## 2021-07-22 DIAGNOSIS — E119 Type 2 diabetes mellitus without complications: Secondary | ICD-10-CM

## 2021-07-22 DIAGNOSIS — Z01810 Encounter for preprocedural cardiovascular examination: Secondary | ICD-10-CM | POA: Diagnosis not present

## 2021-07-22 HISTORY — DX: Sleep apnea, unspecified: G47.30

## 2021-07-24 ENCOUNTER — Ambulatory Visit (HOSPITAL_COMMUNITY): Payer: Medicare HMO | Admitting: Anesthesiology

## 2021-07-24 ENCOUNTER — Ambulatory Visit (HOSPITAL_COMMUNITY)
Admission: RE | Admit: 2021-07-24 | Discharge: 2021-07-24 | Disposition: A | Discharge status: Home / Self Care | Payer: Medicare HMO | Attending: Gastroenterology | Admitting: Gastroenterology

## 2021-07-24 ENCOUNTER — Encounter (HOSPITAL_COMMUNITY): Payer: Self-pay | Admitting: Gastroenterology

## 2021-07-24 ENCOUNTER — Encounter (HOSPITAL_COMMUNITY)
Admission: RE | Disposition: A | Discharge status: Home / Self Care | Payer: Self-pay | Source: Home / Self Care | Attending: Gastroenterology

## 2021-07-24 ENCOUNTER — Other Ambulatory Visit: Payer: Self-pay

## 2021-07-24 DIAGNOSIS — I10 Essential (primary) hypertension: Secondary | ICD-10-CM | POA: Insufficient documentation

## 2021-07-24 DIAGNOSIS — K635 Polyp of colon: Secondary | ICD-10-CM | POA: Diagnosis not present

## 2021-07-24 DIAGNOSIS — D120 Benign neoplasm of cecum: Secondary | ICD-10-CM | POA: Diagnosis not present

## 2021-07-24 DIAGNOSIS — Z1211 Encounter for screening for malignant neoplasm of colon: Secondary | ICD-10-CM | POA: Insufficient documentation

## 2021-07-24 DIAGNOSIS — Z7984 Long term (current) use of oral hypoglycemic drugs: Secondary | ICD-10-CM | POA: Insufficient documentation

## 2021-07-24 DIAGNOSIS — G4730 Sleep apnea, unspecified: Secondary | ICD-10-CM | POA: Insufficient documentation

## 2021-07-24 DIAGNOSIS — E039 Hypothyroidism, unspecified: Secondary | ICD-10-CM | POA: Diagnosis not present

## 2021-07-24 DIAGNOSIS — M797 Fibromyalgia: Secondary | ICD-10-CM | POA: Insufficient documentation

## 2021-07-24 DIAGNOSIS — K5730 Diverticulosis of large intestine without perforation or abscess without bleeding: Secondary | ICD-10-CM | POA: Diagnosis not present

## 2021-07-24 DIAGNOSIS — Z79891 Long term (current) use of opiate analgesic: Secondary | ICD-10-CM | POA: Insufficient documentation

## 2021-07-24 DIAGNOSIS — E119 Type 2 diabetes mellitus without complications: Secondary | ICD-10-CM | POA: Diagnosis not present

## 2021-07-24 HISTORY — PX: COLONOSCOPY WITH PROPOFOL: SHX5780

## 2021-07-24 HISTORY — PX: POLYPECTOMY: SHX5525

## 2021-07-24 LAB — GLUCOSE, CAPILLARY: Glucose-Capillary: 125 mg/dL — ABNORMAL HIGH (ref 70–99)

## 2021-07-24 LAB — HM COLONOSCOPY

## 2021-07-24 SURGERY — COLONOSCOPY WITH PROPOFOL
Anesthesia: General

## 2021-07-24 MED ORDER — LACTATED RINGERS IV SOLN: INTRAVENOUS | Status: DC

## 2021-07-24 MED ORDER — PROPOFOL 500 MG/50ML IV EMUL
INTRAVENOUS | Status: DC | PRN
  Administered 2021-07-24: 150 ug/kg/min via INTRAVENOUS

## 2021-07-24 MED ORDER — PROPOFOL 10 MG/ML IV BOLUS
INTRAVENOUS | Status: DC | PRN
  Administered 2021-07-24: 100 mg via INTRAVENOUS

## 2021-07-24 NOTE — Transfer of Care (Signed)
Immediate Anesthesia Transfer of Care Note  Patient: Denise Daniels  Procedure(s) Performed: COLONOSCOPY WITH PROPOFOL POLYPECTOMY  Patient Location: Short Stay  Anesthesia Type:General  Level of Consciousness: drowsy  Airway & Oxygen Therapy: Patient Spontanous Breathing  Post-op Assessment: Report given to RN and Post -op Vital signs reviewed and stable  Post vital signs: Reviewed and stable  Last Vitals:  Vitals Value Taken Time  BP 103/55 07/24/21 0959  Temp 36.7 C 07/24/21 0959  Pulse 75 07/24/21 0959  Resp 14 07/24/21 0959  SpO2 100 % 07/24/21 0959    Last Pain:  Vitals:   07/24/21 0959  TempSrc: Oral  PainSc: 0-No pain      Patients Stated Pain Goal: 8 (48/88/91 6945)  Complications: No notable events documented.

## 2021-07-24 NOTE — Op Note (Signed)
[facility] Patient Name: Denise Daniels Procedure Date: 07/24/2021 9:10 AM MRN: [id number] Date of Birth: Dec 16, 1970 Attending MD: Maylon Peppers ,  CSN: [id number] Age: 50 Admit Type: Outpatient Procedure:                Colonoscopy Indications:              Screening for colorectal malignant neoplasm Providers:                Maylon Peppers, Lurline Del, RN, Kristine L. Risa Grill, Technician Referring MD:              Medicines:                Monitored Anesthesia Care Complications:            No immediate complications. Estimated Blood Loss:     Estimated blood loss: none. Procedure:                Pre-Anesthesia Assessment:                           - Prior to the procedure, a History and Physical                            was performed, and patient medications, allergies                            and sensitivities were reviewed. The patient's                            tolerance of previous anesthesia was reviewed.                           - The risks and benefits of the procedure and the                            sedation options and risks were discussed with the                            patient. All questions were answered and informed                            consent was obtained.                           - ASA Grade Assessment: II - A patient with mild                            systemic disease.                           After obtaining informed consent, the colonoscope                            was passed under direct vision. Throughout the  procedure, the patient's blood pressure, pulse, and                            oxygen saturations were monitored continuously. The                            [id number]-HQ190L ([id number]) scope was introduced through                            the anus and advanced to the the cecum, identified                            by appendiceal orifice and ileocecal valve. The                             colonoscopy was performed without difficulty. The                            patient tolerated the procedure well. The quality                            of the bowel preparation was excellent. Scope In: 9:32:39 AM Scope Out: 9:55:22 AM Scope Withdrawal Time: 0 hours 17 minutes 7 seconds  Total Procedure Duration: 0 hours 22 minutes 43 seconds  Findings:      The perianal and digital rectal examinations were normal.      A 6 mm polyp was found in the cecum. The polyp was sessile. The polyp       was removed with a cold snare. Resection and retrieval were complete.      A few small-mouthed diverticula were found in the sigmoid colon and       descending colon.      The retroflexed view of the distal rectum and anal verge was normal and       showed no anal or rectal abnormalities. Impression:               - One 6 mm polyp in the cecum, removed with a cold                            snare. Resected and retrieved.                           - Diverticulosis in the sigmoid colon and in the                            descending colon.                           - The distal rectum and anal verge are normal on                            retroflexion view. Moderate Sedation:      Per Anesthesia Care Recommendation:           - Discharge patient to home (ambulatory).                           -  Resume previous diet.                           - Await pathology results.                           - Repeat colonoscopy for surveillance based on                            pathology results. Procedure Code(s):        --- Professional ---                           [phone], Colonoscopy, flexible; with removal of                            tumor(s), polyp(s), or other lesion(s) by snare                            technique Diagnosis Code(s):        --- Professional ---                           Z12.11, Encounter for screening for malignant                            neoplasm of  colon                           K63.5, Polyp of colon                           K57.30, Diverticulosis of large intestine without                            perforation or abscess without bleeding CPT copyright 2019 American Medical Association. All rights reserved. The codes documented in this report are preliminary and upon coder review may  be revised to meet current compliance requirements. Maylon Peppers, MD Maylon Peppers,  07/24/2021 10:03:36 AM This report has been signed electronically. Number of Addenda: 0

## 2021-07-24 NOTE — Anesthesia Postprocedure Evaluation (Signed)
Anesthesia Post Note  Patient: Denise Daniels  Procedure(s) Performed: COLONOSCOPY WITH PROPOFOL POLYPECTOMY  Patient location during evaluation: Phase II Anesthesia Type: General Level of consciousness: awake and alert and oriented Pain management: pain level controlled Vital Signs Assessment: post-procedure vital signs reviewed and stable Respiratory status: spontaneous breathing, nonlabored ventilation and respiratory function stable Cardiovascular status: blood pressure returned to baseline and stable Postop Assessment: no apparent nausea or vomiting Anesthetic complications: no   No notable events documented.   Last Vitals:  Vitals:   07/24/21 0856 07/24/21 0959  BP: (!) 153/89 (!) 103/55  Pulse: 77 75  Resp: 12 14  Temp: 36.7 C 36.7 C  SpO2: 100% 100%    Last Pain:  Vitals:   07/24/21 0959  TempSrc: Oral  PainSc: 0-No pain                 Jaye Polidori C Denise Daniels

## 2021-07-24 NOTE — Discharge Instructions (Signed)
You are being discharged to home.  Resume your previous diet.  We are waiting for your pathology results.  Your physician has recommended a repeat colonoscopy for surveillance based on pathology results.  

## 2021-07-24 NOTE — Anesthesia Preprocedure Evaluation (Signed)
Anesthesia Evaluation  Patient identified by MRN, date of birth, ID band Patient awake    Reviewed: Allergy & Precautions, NPO status , Patient's Chart, lab work & pertinent test results, reviewed documented beta blocker date and time   History of Anesthesia Complications Negative for: history of anesthetic complications  Airway Mallampati: II  TM Distance: >3 FB Neck ROM: Full    Dental  (+) Dental Advisory Given, Teeth Intact   Pulmonary sleep apnea and Continuous Positive Airway Pressure Ventilation ,    Pulmonary exam normal breath sounds clear to auscultation       Cardiovascular Exercise Tolerance: Good hypertension, Pt. on medications and Pt. on home beta blockers Normal cardiovascular exam Rhythm:Regular Rate:Normal     Neuro/Psych  Neuromuscular disease negative psych ROS   GI/Hepatic negative GI ROS, Neg liver ROS,   Endo/Other  diabetes, Well Controlled, Type 2, Oral Hypoglycemic AgentsHypothyroidism   Renal/GU negative Renal ROS     Musculoskeletal  (+) Fibromyalgia -, narcotic dependent  Abdominal   Peds  Hematology   Anesthesia Other Findings   Reproductive/Obstetrics                           Anesthesia Physical Anesthesia Plan  ASA: 3  Anesthesia Plan: General   Post-op Pain Management: Minimal or no pain anticipated   Induction:   PONV Risk Score and Plan: TIVA  Airway Management Planned:   Additional Equipment:   Intra-op Plan:   Post-operative Plan:   Informed Consent: I have reviewed the patients History and Physical, chart, labs and discussed the procedure including the risks, benefits and alternatives for the proposed anesthesia with the patient or authorized representative who has indicated his/her understanding and acceptance.     Dental advisory given  Plan Discussed with: CRNA and Surgeon  Anesthesia Plan Comments:         Anesthesia  Quick Evaluation

## 2021-07-24 NOTE — H&P (Signed)
Denise Daniels is an 50 y.o. female.   Chief Complaint: Screening colonoscopy HPI: 50 year old female with past medical history of diabetes, fibromyalgia, hypertension, hypothyroidism, OSA, coming for screening colonoscopy. The patient has never had a colonoscopy in the past.  The patient denies having any complaints such as melena, hematochezia, abdominal pain or distention, change in her bowel movement consistency or frequency, no changes in her weight recently.  No family history of colorectal cancer.   Past Medical History:  Diagnosis Date   Diabetes mellitus without complication ([place]) 66/29/4765   Fibromyalgia    Hypertension    Hypothyroidism    Sleep apnea    Thyroid disease     Past Surgical History:  Procedure Laterality Date   ABDOMINAL HYSTERECTOMY     ACHILLES TENDON REPAIR Right    CARDIAC CATHETERIZATION     [place]     COLON SURGERY  2013   perfed diverticuli   HERNIA REPAIR  2014   abd wall   LAPAROSCOPIC ENDOMETRIOSIS FULGURATION     two laparoscopies   PARTIAL HYSTERECTOMY     PLANTAR FASCIA SURGERY Bilateral     Family History  Problem Relation Age of Onset   Kidney disease Mother    Heart failure Mother    Diabetes Mother        Type I   Stroke Mother    Breast cancer Mother    Diabetes Father    Heart attack Father    Stroke Father    Aneurysm Paternal Aunt        Brain   Stroke Paternal Aunt    Cancer Paternal Uncle        lung   Aneurysm Paternal Aunt        brain   Stroke Paternal Aunt    Social History:  reports that she has never smoked. She has never used smokeless tobacco. She reports that she does not drink alcohol and does not use drugs.  Allergies:  Allergies  Allergen Reactions   Statins Anaphylaxis, Shortness Of Breath and Other (See Comments)    Irritable, fatigue. Has tried pravastatin, lovastatin     Medications Prior to Admission  Medication Sig Dispense Refill   ADVAIR DISKUS 500-50  MCG/ACT AEPB Inhale 1 puff into the lungs in the morning and at bedtime.     atenolol (TENORMIN) 50 MG tablet Take 50 mg by mouth 2 (two) times daily.     carisoprodol (SOMA) 350 MG tablet Take 350 mg by mouth daily.     Dulaglutide (TRULICITY) 3 YY/5.0PT SOPN Inject 3 mg into the skin every Tuesday.     furosemide (LASIX) 40 MG tablet Take 40 mg by mouth.     gabapentin (NEURONTIN) 300 MG capsule Take 300 mg by mouth 3 (three) times daily.     levothyroxine (SYNTHROID) 137 MCG tablet Take 137 mcg by mouth daily before breakfast.     metFORMIN (GLUCOPHAGE-XR) 500 MG 24 hr tablet Take 1,000 mg by mouth daily.     pantoprazole (PROTONIX) 20 MG tablet Take 20 mg by mouth daily.     polyethylene glycol-electrolytes (TRILYTE) 420 g solution Take 4,000 mLs by mouth as directed. 4000 mL 0   traMADol (ULTRAM) 50 MG tablet Take 50 mg by mouth 2 (two) times daily as needed for moderate pain.     venlafaxine XR (EFFEXOR-XR) 150 MG 24 hr capsule Take 150 mg by mouth daily with breakfast.     VENTOLIN  HFA 108 (90 Base) MCG/ACT inhaler Inhale 1-2 puffs into the lungs every 6 (six) hours as needed for shortness of breath or wheezing.     amoxicillin (AMOXIL) 500 MG capsule Take 1 capsule (500 mg total) 3 (three) times daily by mouth. (Patient not taking: Reported on 07/16/2021) 21 capsule 0   atenolol (TENORMIN) 25 MG tablet Take 1 tablet (25 mg total) by mouth daily. (Patient not taking: Reported on 07/16/2021) 30 tablet 2   benzonatate (TESSALON) 100 MG capsule 1 - 2 po q 8 hour prn cough. (Patient not taking: Reported on 07/16/2021) 20 capsule 3   levothyroxine (SYNTHROID, LEVOTHROID) 150 MCG tablet TAKE 1 TABLET BY MOUTH ONCE DAILY (Patient not taking: Reported on 07/16/2021) 30 tablet 3   metFORMIN (GLUCOPHAGE) 1000 MG tablet Take 1 tablet (1,000 mg total) by mouth 2 (two) times daily with a meal. (Patient not taking: Reported on 06/29/2017) 60 tablet 4   promethazine-dextromethorphan (PROMETHAZINE-DM) 6.25-15  MG/5ML syrup Take 5 mLs 4 (four) times daily as needed by mouth for cough. (Patient not taking: Reported on 07/16/2021) 118 mL 0   vitamin B-12 (CYANOCOBALAMIN) 500 MCG tablet Take 500 mcg by mouth daily. (Patient not taking: Reported on 07/24/2021)      No results found for this or any previous visit (from the past 48 hour(s)). No results found.  Review of Systems  Constitutional: Negative.   HENT: Negative.    Eyes: Negative.   Respiratory: Negative.    Cardiovascular: Negative.   Gastrointestinal: Negative.   Endocrine: Negative.   Genitourinary: Negative.   Musculoskeletal: Negative.   Skin: Negative.   Allergic/Immunologic: Negative.   Neurological: Negative.   Hematological: Negative.   Psychiatric/Behavioral: Negative.     Blood pressure (!) 153/89, pulse 77, temperature 98.1 F (36.7 C), temperature source Oral, resp. rate 12, height 5\' 7"  (1.702 m), weight 110 kg, SpO2 100 %. Physical Exam  GENERAL: The patient is AO x3, in no acute distress. Obese.  HEENT: Head is normocephalic and atraumatic. EOMI are intact. Mouth is well hydrated and without lesions. NECK: Supple. No masses LUNGS: Clear to auscultation. No presence of rhonchi/wheezing/rales. Adequate chest expansion HEART: RRR, normal s1 and s2. ABDOMEN: Soft, nontender, no guarding, no peritoneal signs, and nondistended. BS +. No masses. EXTREMITIES: Without any cyanosis, clubbing, rash, lesions or edema. NEUROLOGIC: AOx3, no focal motor deficit. SKIN: no jaundice, no rashes  Assessment/Plan 50 year old female with past medical history of diabetes, fibromyalgia, hypertension, hypothyroidism, OSA, coming for screening colonoscopy. The patient is at average risk for colorectal cancer.  We will proceed with colonoscopy today.   Harvel Quale, MD 07/24/2021, 9:12 AM

## 2021-07-29 DIAGNOSIS — H35033 Hypertensive retinopathy, bilateral: Secondary | ICD-10-CM | POA: Diagnosis not present

## 2021-07-29 DIAGNOSIS — H524 Presbyopia: Secondary | ICD-10-CM | POA: Diagnosis not present

## 2021-07-30 DIAGNOSIS — Z0100 Encounter for examination of eyes and vision without abnormal findings: Secondary | ICD-10-CM | POA: Diagnosis not present

## 2021-07-31 DIAGNOSIS — M9901 Segmental and somatic dysfunction of cervical region: Secondary | ICD-10-CM | POA: Diagnosis not present

## 2021-07-31 DIAGNOSIS — M9903 Segmental and somatic dysfunction of lumbar region: Secondary | ICD-10-CM | POA: Diagnosis not present

## 2021-07-31 DIAGNOSIS — M9902 Segmental and somatic dysfunction of thoracic region: Secondary | ICD-10-CM | POA: Diagnosis not present

## 2021-07-31 DIAGNOSIS — M62830 Muscle spasm of back: Secondary | ICD-10-CM | POA: Diagnosis not present

## 2021-07-31 DIAGNOSIS — M542 Cervicalgia: Secondary | ICD-10-CM | POA: Diagnosis not present

## 2021-07-31 DIAGNOSIS — M546 Pain in thoracic spine: Secondary | ICD-10-CM | POA: Diagnosis not present

## 2021-08-01 LAB — SURGICAL PATHOLOGY

## 2021-08-02 ENCOUNTER — Encounter (HOSPITAL_COMMUNITY): Payer: Self-pay | Admitting: Gastroenterology

## 2021-08-06 ENCOUNTER — Encounter (INDEPENDENT_AMBULATORY_CARE_PROVIDER_SITE_OTHER): Payer: Self-pay | Admitting: *Deleted

## 2021-08-14 DIAGNOSIS — M9901 Segmental and somatic dysfunction of cervical region: Secondary | ICD-10-CM | POA: Diagnosis not present

## 2021-08-14 DIAGNOSIS — M9902 Segmental and somatic dysfunction of thoracic region: Secondary | ICD-10-CM | POA: Diagnosis not present

## 2021-08-14 DIAGNOSIS — M62830 Muscle spasm of back: Secondary | ICD-10-CM | POA: Diagnosis not present

## 2021-08-14 DIAGNOSIS — M546 Pain in thoracic spine: Secondary | ICD-10-CM | POA: Diagnosis not present

## 2021-08-14 DIAGNOSIS — M9903 Segmental and somatic dysfunction of lumbar region: Secondary | ICD-10-CM | POA: Diagnosis not present

## 2021-08-14 DIAGNOSIS — M542 Cervicalgia: Secondary | ICD-10-CM | POA: Diagnosis not present

## 2021-09-03 DIAGNOSIS — M9901 Segmental and somatic dysfunction of cervical region: Secondary | ICD-10-CM | POA: Diagnosis not present

## 2021-09-03 DIAGNOSIS — M9902 Segmental and somatic dysfunction of thoracic region: Secondary | ICD-10-CM | POA: Diagnosis not present

## 2021-09-03 DIAGNOSIS — M546 Pain in thoracic spine: Secondary | ICD-10-CM | POA: Diagnosis not present

## 2021-09-03 DIAGNOSIS — M9903 Segmental and somatic dysfunction of lumbar region: Secondary | ICD-10-CM | POA: Diagnosis not present

## 2021-09-03 DIAGNOSIS — M542 Cervicalgia: Secondary | ICD-10-CM | POA: Diagnosis not present

## 2021-09-03 DIAGNOSIS — M62830 Muscle spasm of back: Secondary | ICD-10-CM | POA: Diagnosis not present

## 2021-09-12 DIAGNOSIS — L309 Dermatitis, unspecified: Secondary | ICD-10-CM | POA: Diagnosis not present

## 2021-09-12 DIAGNOSIS — G4730 Sleep apnea, unspecified: Secondary | ICD-10-CM | POA: Diagnosis not present

## 2021-09-12 DIAGNOSIS — Z6839 Body mass index (BMI) 39.0-39.9, adult: Secondary | ICD-10-CM | POA: Diagnosis not present

## 2021-09-13 DIAGNOSIS — M542 Cervicalgia: Secondary | ICD-10-CM | POA: Diagnosis not present

## 2021-09-13 DIAGNOSIS — M9901 Segmental and somatic dysfunction of cervical region: Secondary | ICD-10-CM | POA: Diagnosis not present

## 2021-09-13 DIAGNOSIS — M9902 Segmental and somatic dysfunction of thoracic region: Secondary | ICD-10-CM | POA: Diagnosis not present

## 2021-09-13 DIAGNOSIS — M62830 Muscle spasm of back: Secondary | ICD-10-CM | POA: Diagnosis not present

## 2021-09-13 DIAGNOSIS — M546 Pain in thoracic spine: Secondary | ICD-10-CM | POA: Diagnosis not present

## 2021-09-13 DIAGNOSIS — M9903 Segmental and somatic dysfunction of lumbar region: Secondary | ICD-10-CM | POA: Diagnosis not present

## 2021-09-23 DIAGNOSIS — M9903 Segmental and somatic dysfunction of lumbar region: Secondary | ICD-10-CM | POA: Diagnosis not present

## 2021-09-23 DIAGNOSIS — M542 Cervicalgia: Secondary | ICD-10-CM | POA: Diagnosis not present

## 2021-09-23 DIAGNOSIS — M9901 Segmental and somatic dysfunction of cervical region: Secondary | ICD-10-CM | POA: Diagnosis not present

## 2021-09-23 DIAGNOSIS — M546 Pain in thoracic spine: Secondary | ICD-10-CM | POA: Diagnosis not present

## 2021-09-23 DIAGNOSIS — M9902 Segmental and somatic dysfunction of thoracic region: Secondary | ICD-10-CM | POA: Diagnosis not present

## 2021-09-23 DIAGNOSIS — M62830 Muscle spasm of back: Secondary | ICD-10-CM | POA: Diagnosis not present

## 2021-10-01 DIAGNOSIS — R21 Rash and other nonspecific skin eruption: Secondary | ICD-10-CM | POA: Diagnosis not present

## 2021-10-01 DIAGNOSIS — L570 Actinic keratosis: Secondary | ICD-10-CM | POA: Diagnosis not present

## 2021-10-01 DIAGNOSIS — L578 Other skin changes due to chronic exposure to nonionizing radiation: Secondary | ICD-10-CM | POA: Diagnosis not present

## 2021-10-14 DIAGNOSIS — M546 Pain in thoracic spine: Secondary | ICD-10-CM | POA: Diagnosis not present

## 2021-10-14 DIAGNOSIS — M62830 Muscle spasm of back: Secondary | ICD-10-CM | POA: Diagnosis not present

## 2021-10-14 DIAGNOSIS — M542 Cervicalgia: Secondary | ICD-10-CM | POA: Diagnosis not present

## 2021-10-14 DIAGNOSIS — M9903 Segmental and somatic dysfunction of lumbar region: Secondary | ICD-10-CM | POA: Diagnosis not present

## 2021-10-14 DIAGNOSIS — M9902 Segmental and somatic dysfunction of thoracic region: Secondary | ICD-10-CM | POA: Diagnosis not present

## 2021-10-14 DIAGNOSIS — M9901 Segmental and somatic dysfunction of cervical region: Secondary | ICD-10-CM | POA: Diagnosis not present

## 2021-10-23 DIAGNOSIS — Z6839 Body mass index (BMI) 39.0-39.9, adult: Secondary | ICD-10-CM | POA: Diagnosis not present

## 2021-10-23 DIAGNOSIS — E669 Obesity, unspecified: Secondary | ICD-10-CM | POA: Diagnosis not present

## 2021-10-23 DIAGNOSIS — E1143 Type 2 diabetes mellitus with diabetic autonomic (poly)neuropathy: Secondary | ICD-10-CM | POA: Diagnosis not present

## 2021-10-23 DIAGNOSIS — E038 Other specified hypothyroidism: Secondary | ICD-10-CM | POA: Diagnosis not present

## 2021-10-23 DIAGNOSIS — I10 Essential (primary) hypertension: Secondary | ICD-10-CM | POA: Diagnosis not present

## 2021-10-23 DIAGNOSIS — G4730 Sleep apnea, unspecified: Secondary | ICD-10-CM | POA: Diagnosis not present

## 2021-10-23 DIAGNOSIS — J4540 Moderate persistent asthma, uncomplicated: Secondary | ICD-10-CM | POA: Diagnosis not present

## 2021-11-01 DIAGNOSIS — M9901 Segmental and somatic dysfunction of cervical region: Secondary | ICD-10-CM | POA: Diagnosis not present

## 2021-11-01 DIAGNOSIS — M546 Pain in thoracic spine: Secondary | ICD-10-CM | POA: Diagnosis not present

## 2021-11-01 DIAGNOSIS — M9902 Segmental and somatic dysfunction of thoracic region: Secondary | ICD-10-CM | POA: Diagnosis not present

## 2021-11-01 DIAGNOSIS — M9903 Segmental and somatic dysfunction of lumbar region: Secondary | ICD-10-CM | POA: Diagnosis not present

## 2021-11-01 DIAGNOSIS — M542 Cervicalgia: Secondary | ICD-10-CM | POA: Diagnosis not present

## 2021-11-01 DIAGNOSIS — M62830 Muscle spasm of back: Secondary | ICD-10-CM | POA: Diagnosis not present

## 2021-11-11 DIAGNOSIS — M9901 Segmental and somatic dysfunction of cervical region: Secondary | ICD-10-CM | POA: Diagnosis not present

## 2021-11-11 DIAGNOSIS — M542 Cervicalgia: Secondary | ICD-10-CM | POA: Diagnosis not present

## 2021-11-11 DIAGNOSIS — E782 Mixed hyperlipidemia: Secondary | ICD-10-CM | POA: Diagnosis not present

## 2021-11-11 DIAGNOSIS — M62830 Muscle spasm of back: Secondary | ICD-10-CM | POA: Diagnosis not present

## 2021-11-11 DIAGNOSIS — I10 Essential (primary) hypertension: Secondary | ICD-10-CM | POA: Diagnosis not present

## 2021-11-11 DIAGNOSIS — M546 Pain in thoracic spine: Secondary | ICD-10-CM | POA: Diagnosis not present

## 2021-11-11 DIAGNOSIS — M9902 Segmental and somatic dysfunction of thoracic region: Secondary | ICD-10-CM | POA: Diagnosis not present

## 2021-11-11 DIAGNOSIS — M9903 Segmental and somatic dysfunction of lumbar region: Secondary | ICD-10-CM | POA: Diagnosis not present

## 2021-11-12 DIAGNOSIS — L820 Inflamed seborrheic keratosis: Secondary | ICD-10-CM | POA: Diagnosis not present

## 2021-11-12 DIAGNOSIS — I10 Essential (primary) hypertension: Secondary | ICD-10-CM | POA: Diagnosis not present

## 2021-11-12 DIAGNOSIS — L570 Actinic keratosis: Secondary | ICD-10-CM | POA: Diagnosis not present

## 2021-11-12 DIAGNOSIS — E039 Hypothyroidism, unspecified: Secondary | ICD-10-CM | POA: Diagnosis not present

## 2021-11-12 DIAGNOSIS — L578 Other skin changes due to chronic exposure to nonionizing radiation: Secondary | ICD-10-CM | POA: Diagnosis not present

## 2021-11-12 DIAGNOSIS — F32A Depression, unspecified: Secondary | ICD-10-CM | POA: Diagnosis not present

## 2021-11-12 DIAGNOSIS — E785 Hyperlipidemia, unspecified: Secondary | ICD-10-CM | POA: Diagnosis not present

## 2021-11-12 DIAGNOSIS — E119 Type 2 diabetes mellitus without complications: Secondary | ICD-10-CM | POA: Diagnosis not present

## 2021-11-29 DIAGNOSIS — M9902 Segmental and somatic dysfunction of thoracic region: Secondary | ICD-10-CM | POA: Diagnosis not present

## 2021-11-29 DIAGNOSIS — M546 Pain in thoracic spine: Secondary | ICD-10-CM | POA: Diagnosis not present

## 2021-11-29 DIAGNOSIS — M9903 Segmental and somatic dysfunction of lumbar region: Secondary | ICD-10-CM | POA: Diagnosis not present

## 2021-11-29 DIAGNOSIS — M542 Cervicalgia: Secondary | ICD-10-CM | POA: Diagnosis not present

## 2021-11-29 DIAGNOSIS — M62830 Muscle spasm of back: Secondary | ICD-10-CM | POA: Diagnosis not present

## 2021-11-29 DIAGNOSIS — M9901 Segmental and somatic dysfunction of cervical region: Secondary | ICD-10-CM | POA: Diagnosis not present

## 2021-12-13 DIAGNOSIS — M62830 Muscle spasm of back: Secondary | ICD-10-CM | POA: Diagnosis not present

## 2021-12-13 DIAGNOSIS — M542 Cervicalgia: Secondary | ICD-10-CM | POA: Diagnosis not present

## 2021-12-13 DIAGNOSIS — M9902 Segmental and somatic dysfunction of thoracic region: Secondary | ICD-10-CM | POA: Diagnosis not present

## 2021-12-13 DIAGNOSIS — M546 Pain in thoracic spine: Secondary | ICD-10-CM | POA: Diagnosis not present

## 2021-12-13 DIAGNOSIS — M9903 Segmental and somatic dysfunction of lumbar region: Secondary | ICD-10-CM | POA: Diagnosis not present

## 2021-12-13 DIAGNOSIS — M9901 Segmental and somatic dysfunction of cervical region: Secondary | ICD-10-CM | POA: Diagnosis not present

## 2022-01-03 DIAGNOSIS — M546 Pain in thoracic spine: Secondary | ICD-10-CM | POA: Diagnosis not present

## 2022-01-03 DIAGNOSIS — M62830 Muscle spasm of back: Secondary | ICD-10-CM | POA: Diagnosis not present

## 2022-01-03 DIAGNOSIS — M9903 Segmental and somatic dysfunction of lumbar region: Secondary | ICD-10-CM | POA: Diagnosis not present

## 2022-01-03 DIAGNOSIS — M542 Cervicalgia: Secondary | ICD-10-CM | POA: Diagnosis not present

## 2022-01-03 DIAGNOSIS — M9901 Segmental and somatic dysfunction of cervical region: Secondary | ICD-10-CM | POA: Diagnosis not present

## 2022-01-03 DIAGNOSIS — M9902 Segmental and somatic dysfunction of thoracic region: Secondary | ICD-10-CM | POA: Diagnosis not present

## 2022-01-13 DIAGNOSIS — M9901 Segmental and somatic dysfunction of cervical region: Secondary | ICD-10-CM | POA: Diagnosis not present

## 2022-01-13 DIAGNOSIS — M9903 Segmental and somatic dysfunction of lumbar region: Secondary | ICD-10-CM | POA: Diagnosis not present

## 2022-01-13 DIAGNOSIS — M542 Cervicalgia: Secondary | ICD-10-CM | POA: Diagnosis not present

## 2022-01-13 DIAGNOSIS — M546 Pain in thoracic spine: Secondary | ICD-10-CM | POA: Diagnosis not present

## 2022-01-13 DIAGNOSIS — M9902 Segmental and somatic dysfunction of thoracic region: Secondary | ICD-10-CM | POA: Diagnosis not present

## 2022-01-13 DIAGNOSIS — M62830 Muscle spasm of back: Secondary | ICD-10-CM | POA: Diagnosis not present

## 2022-01-16 DIAGNOSIS — M170 Bilateral primary osteoarthritis of knee: Secondary | ICD-10-CM | POA: Diagnosis not present

## 2022-01-24 DIAGNOSIS — M9903 Segmental and somatic dysfunction of lumbar region: Secondary | ICD-10-CM | POA: Diagnosis not present

## 2022-01-24 DIAGNOSIS — M62830 Muscle spasm of back: Secondary | ICD-10-CM | POA: Diagnosis not present

## 2022-01-24 DIAGNOSIS — M542 Cervicalgia: Secondary | ICD-10-CM | POA: Diagnosis not present

## 2022-01-24 DIAGNOSIS — M9902 Segmental and somatic dysfunction of thoracic region: Secondary | ICD-10-CM | POA: Diagnosis not present

## 2022-01-24 DIAGNOSIS — M9901 Segmental and somatic dysfunction of cervical region: Secondary | ICD-10-CM | POA: Diagnosis not present

## 2022-01-24 DIAGNOSIS — M546 Pain in thoracic spine: Secondary | ICD-10-CM | POA: Diagnosis not present

## 2022-01-27 DIAGNOSIS — H47333 Pseudopapilledema of optic disc, bilateral: Secondary | ICD-10-CM | POA: Diagnosis not present

## 2022-01-30 DIAGNOSIS — Z0000 Encounter for general adult medical examination without abnormal findings: Secondary | ICD-10-CM | POA: Diagnosis not present

## 2022-01-30 DIAGNOSIS — Z6839 Body mass index (BMI) 39.0-39.9, adult: Secondary | ICD-10-CM | POA: Diagnosis not present

## 2022-01-30 DIAGNOSIS — E1143 Type 2 diabetes mellitus with diabetic autonomic (poly)neuropathy: Secondary | ICD-10-CM | POA: Diagnosis not present

## 2022-01-30 DIAGNOSIS — G4730 Sleep apnea, unspecified: Secondary | ICD-10-CM | POA: Diagnosis not present

## 2022-01-30 DIAGNOSIS — Z6838 Body mass index (BMI) 38.0-38.9, adult: Secondary | ICD-10-CM | POA: Diagnosis not present

## 2022-01-30 DIAGNOSIS — J4540 Moderate persistent asthma, uncomplicated: Secondary | ICD-10-CM | POA: Diagnosis not present

## 2022-01-30 DIAGNOSIS — E038 Other specified hypothyroidism: Secondary | ICD-10-CM | POA: Diagnosis not present

## 2022-01-30 DIAGNOSIS — I10 Essential (primary) hypertension: Secondary | ICD-10-CM | POA: Diagnosis not present

## 2022-02-05 DIAGNOSIS — M9903 Segmental and somatic dysfunction of lumbar region: Secondary | ICD-10-CM | POA: Diagnosis not present

## 2022-02-05 DIAGNOSIS — M9902 Segmental and somatic dysfunction of thoracic region: Secondary | ICD-10-CM | POA: Diagnosis not present

## 2022-02-05 DIAGNOSIS — M546 Pain in thoracic spine: Secondary | ICD-10-CM | POA: Diagnosis not present

## 2022-02-05 DIAGNOSIS — M9901 Segmental and somatic dysfunction of cervical region: Secondary | ICD-10-CM | POA: Diagnosis not present

## 2022-02-05 DIAGNOSIS — M542 Cervicalgia: Secondary | ICD-10-CM | POA: Diagnosis not present

## 2022-02-05 DIAGNOSIS — M62830 Muscle spasm of back: Secondary | ICD-10-CM | POA: Diagnosis not present

## 2022-02-10 DIAGNOSIS — H47333 Pseudopapilledema of optic disc, bilateral: Secondary | ICD-10-CM | POA: Diagnosis not present

## 2022-02-10 DIAGNOSIS — H2513 Age-related nuclear cataract, bilateral: Secondary | ICD-10-CM | POA: Diagnosis not present

## 2022-02-10 DIAGNOSIS — H25013 Cortical age-related cataract, bilateral: Secondary | ICD-10-CM | POA: Diagnosis not present

## 2022-02-10 DIAGNOSIS — H25813 Combined forms of age-related cataract, bilateral: Secondary | ICD-10-CM | POA: Diagnosis not present

## 2022-02-21 DIAGNOSIS — M546 Pain in thoracic spine: Secondary | ICD-10-CM | POA: Diagnosis not present

## 2022-02-21 DIAGNOSIS — M62830 Muscle spasm of back: Secondary | ICD-10-CM | POA: Diagnosis not present

## 2022-02-21 DIAGNOSIS — M542 Cervicalgia: Secondary | ICD-10-CM | POA: Diagnosis not present

## 2022-02-21 DIAGNOSIS — M9901 Segmental and somatic dysfunction of cervical region: Secondary | ICD-10-CM | POA: Diagnosis not present

## 2022-02-21 DIAGNOSIS — M9903 Segmental and somatic dysfunction of lumbar region: Secondary | ICD-10-CM | POA: Diagnosis not present

## 2022-02-21 DIAGNOSIS — M9902 Segmental and somatic dysfunction of thoracic region: Secondary | ICD-10-CM | POA: Diagnosis not present

## 2022-03-21 DIAGNOSIS — M546 Pain in thoracic spine: Secondary | ICD-10-CM | POA: Diagnosis not present

## 2022-03-21 DIAGNOSIS — M542 Cervicalgia: Secondary | ICD-10-CM | POA: Diagnosis not present

## 2022-03-21 DIAGNOSIS — M62830 Muscle spasm of back: Secondary | ICD-10-CM | POA: Diagnosis not present

## 2022-03-21 DIAGNOSIS — M9903 Segmental and somatic dysfunction of lumbar region: Secondary | ICD-10-CM | POA: Diagnosis not present

## 2022-03-21 DIAGNOSIS — M9902 Segmental and somatic dysfunction of thoracic region: Secondary | ICD-10-CM | POA: Diagnosis not present

## 2022-03-21 DIAGNOSIS — M9901 Segmental and somatic dysfunction of cervical region: Secondary | ICD-10-CM | POA: Diagnosis not present

## 2022-03-31 DIAGNOSIS — H00011 Hordeolum externum right upper eyelid: Secondary | ICD-10-CM | POA: Diagnosis not present

## 2022-03-31 DIAGNOSIS — E079 Disorder of thyroid, unspecified: Secondary | ICD-10-CM | POA: Diagnosis not present

## 2022-03-31 DIAGNOSIS — E119 Type 2 diabetes mellitus without complications: Secondary | ICD-10-CM | POA: Diagnosis not present

## 2022-03-31 DIAGNOSIS — H4710 Unspecified papilledema: Secondary | ICD-10-CM | POA: Diagnosis not present

## 2022-04-01 ENCOUNTER — Other Ambulatory Visit: Payer: Self-pay | Admitting: Ophthalmology

## 2022-04-01 DIAGNOSIS — H4710 Unspecified papilledema: Secondary | ICD-10-CM

## 2022-04-01 DIAGNOSIS — E079 Disorder of thyroid, unspecified: Secondary | ICD-10-CM

## 2022-04-09 DIAGNOSIS — M546 Pain in thoracic spine: Secondary | ICD-10-CM | POA: Diagnosis not present

## 2022-04-09 DIAGNOSIS — M9903 Segmental and somatic dysfunction of lumbar region: Secondary | ICD-10-CM | POA: Diagnosis not present

## 2022-04-09 DIAGNOSIS — M62830 Muscle spasm of back: Secondary | ICD-10-CM | POA: Diagnosis not present

## 2022-04-09 DIAGNOSIS — M9901 Segmental and somatic dysfunction of cervical region: Secondary | ICD-10-CM | POA: Diagnosis not present

## 2022-04-09 DIAGNOSIS — M542 Cervicalgia: Secondary | ICD-10-CM | POA: Diagnosis not present

## 2022-04-09 DIAGNOSIS — M9902 Segmental and somatic dysfunction of thoracic region: Secondary | ICD-10-CM | POA: Diagnosis not present

## 2022-04-10 ENCOUNTER — Ambulatory Visit (INDEPENDENT_AMBULATORY_CARE_PROVIDER_SITE_OTHER): Payer: Medicare HMO

## 2022-04-10 ENCOUNTER — Encounter: Payer: Self-pay | Admitting: Podiatry

## 2022-04-10 ENCOUNTER — Ambulatory Visit (INDEPENDENT_AMBULATORY_CARE_PROVIDER_SITE_OTHER): Payer: Medicare HMO | Admitting: Podiatry

## 2022-04-10 DIAGNOSIS — E038 Other specified hypothyroidism: Secondary | ICD-10-CM | POA: Diagnosis not present

## 2022-04-10 DIAGNOSIS — G4730 Sleep apnea, unspecified: Secondary | ICD-10-CM | POA: Diagnosis not present

## 2022-04-10 DIAGNOSIS — J4540 Moderate persistent asthma, uncomplicated: Secondary | ICD-10-CM | POA: Diagnosis not present

## 2022-04-10 DIAGNOSIS — I10 Essential (primary) hypertension: Secondary | ICD-10-CM | POA: Diagnosis not present

## 2022-04-10 DIAGNOSIS — M7661 Achilles tendinitis, right leg: Secondary | ICD-10-CM

## 2022-04-10 DIAGNOSIS — M722 Plantar fascial fibromatosis: Secondary | ICD-10-CM | POA: Diagnosis not present

## 2022-04-10 DIAGNOSIS — E1143 Type 2 diabetes mellitus with diabetic autonomic (poly)neuropathy: Secondary | ICD-10-CM | POA: Diagnosis not present

## 2022-04-10 MED ORDER — TRIAMCINOLONE ACETONIDE 10 MG/ML IJ SUSP
10.0000 mg | Freq: Once | INTRAMUSCULAR | Status: AC
  Administered 2022-04-10: 10 mg

## 2022-04-10 NOTE — Progress Notes (Signed)
Subjective:   Patient ID: Denise Daniels, female   DOB: 51 y.o.   MRN: [id number]   HPI Patient states she developed a lot of pain in the back of her right heel for the last 6 weeks and she tries to be active and states that its been hard to wear shoe gear.  She also was complaining some pain in the big toe joint but she may be walking differently because of the heel.  Patient does not smoke likes to be active   Review of Systems  All other systems reviewed and are negative.       Objective:  Physical Exam Vitals and nursing note reviewed.  Constitutional:      Appearance: She is well-developed.  Pulmonary:     Effort: Pulmonary effort is normal.  Musculoskeletal:        General: Normal range of motion.  Skin:    General: Skin is warm.  Neurological:     Mental Status: She is alert.     Neurovascular status found to be intact muscle strength was found to be adequate range of motion adequate.  Patient has exquisite discomfort medial side right heel at the insertion of the tendon into the calcaneus with fluid buildup around this area.  Patient has good digital perfusion well oriented x3 with no muscle strength loss mild equinus      Assessment:  Achilles tendinitis right with inflammation fluid of the medial side insertional point tendon calcaneus     Plan:  H&P reviewed condition and went ahead today and recommended careful injection explained procedure risk and the fact that rupture can occur.  She is wonder except risk and I did sterile prep injected the medial side 3 mg Dexasone Kenalog 5 mg Xylocaine applied air fracture walker to completely immobilize advised on immobilization ice therapy and reappoint again in 3 weeks  X-rays indicate posterior spur formation no indication stress fracture arthritis

## 2022-04-10 NOTE — Patient Instructions (Signed)

## 2022-04-11 ENCOUNTER — Inpatient Hospital Stay: Admission: RE | Admit: 2022-04-11 | Payer: Medicare HMO | Source: Ambulatory Visit

## 2022-04-11 ENCOUNTER — Other Ambulatory Visit: Payer: Medicare HMO

## 2022-04-15 DIAGNOSIS — M170 Bilateral primary osteoarthritis of knee: Secondary | ICD-10-CM | POA: Diagnosis not present

## 2022-04-18 ENCOUNTER — Ambulatory Visit
Admission: RE | Admit: 2022-04-18 | Discharge: 2022-04-18 | Disposition: A | Discharge status: Home / Self Care | Payer: Medicare HMO | Source: Ambulatory Visit | Attending: Ophthalmology | Admitting: Ophthalmology

## 2022-04-18 DIAGNOSIS — G43909 Migraine, unspecified, not intractable, without status migrainosus: Secondary | ICD-10-CM | POA: Diagnosis not present

## 2022-04-18 DIAGNOSIS — R220 Localized swelling, mass and lump, head: Secondary | ICD-10-CM | POA: Diagnosis not present

## 2022-04-18 DIAGNOSIS — J341 Cyst and mucocele of nose and nasal sinus: Secondary | ICD-10-CM | POA: Diagnosis not present

## 2022-04-18 DIAGNOSIS — E079 Disorder of thyroid, unspecified: Secondary | ICD-10-CM

## 2022-04-18 DIAGNOSIS — H4710 Unspecified papilledema: Secondary | ICD-10-CM

## 2022-04-18 DIAGNOSIS — R519 Headache, unspecified: Secondary | ICD-10-CM | POA: Diagnosis not present

## 2022-04-21 ENCOUNTER — Telehealth: Payer: Self-pay | Admitting: Neurology

## 2022-04-21 ENCOUNTER — Encounter: Payer: Self-pay | Admitting: Neurology

## 2022-04-21 ENCOUNTER — Ambulatory Visit (INDEPENDENT_AMBULATORY_CARE_PROVIDER_SITE_OTHER): Payer: Medicare HMO | Admitting: Neurology

## 2022-04-21 VITALS — BP 112/73 | HR 68 | Ht 67.0 in | Wt 244.2 lb

## 2022-04-21 DIAGNOSIS — H499 Unspecified paralytic strabismus: Secondary | ICD-10-CM

## 2022-04-21 DIAGNOSIS — G8929 Other chronic pain: Secondary | ICD-10-CM

## 2022-04-21 DIAGNOSIS — J3489 Other specified disorders of nose and nasal sinuses: Secondary | ICD-10-CM

## 2022-04-21 DIAGNOSIS — H539 Unspecified visual disturbance: Secondary | ICD-10-CM | POA: Diagnosis not present

## 2022-04-21 DIAGNOSIS — Z79899 Other long term (current) drug therapy: Secondary | ICD-10-CM | POA: Diagnosis not present

## 2022-04-21 DIAGNOSIS — H0520 Unspecified exophthalmos: Secondary | ICD-10-CM

## 2022-04-21 DIAGNOSIS — H4710 Unspecified papilledema: Secondary | ICD-10-CM

## 2022-04-21 DIAGNOSIS — H4711 Papilledema associated with increased intracranial pressure: Secondary | ICD-10-CM | POA: Diagnosis not present

## 2022-04-21 DIAGNOSIS — R519 Headache, unspecified: Secondary | ICD-10-CM | POA: Diagnosis not present

## 2022-04-21 DIAGNOSIS — G932 Benign intracranial hypertension: Secondary | ICD-10-CM | POA: Diagnosis not present

## 2022-04-21 DIAGNOSIS — E1142 Type 2 diabetes mellitus with diabetic polyneuropathy: Secondary | ICD-10-CM | POA: Diagnosis not present

## 2022-04-21 DIAGNOSIS — R7989 Other specified abnormal findings of blood chemistry: Secondary | ICD-10-CM | POA: Diagnosis not present

## 2022-04-21 NOTE — Progress Notes (Signed)
[facility]   Provider:  Dr Jaynee Eagles Requesting Provider: Lisabeth Pick, MD Primary Care Provider:  Neale Burly, MD  CC:  nerve head edema from [facility], optic nerve head edema right with completely opacified right sphenoid sinus and vision changes with right eye proptosis without signs of thyroid eye disease on CT brain and orbits, no diabetic eye disease  HPI:  Denise Daniels is a 51 y.o. female here as requested by Lisabeth Pick, MD for possible optic nerve head edema. PMHx Diabetes, HTN, HTN eye disease, neuropathy feet.  She had a CT that showed right sinus sphenoid completed opacified which can be causing a lot of issues including headaches. She is having headaches, significant vision changes right, eye proptosis. Multiple other symptoms but at this time need to focus and try to get her to ENT asap to see if sphenoid sinus could be causing these issues or if we need to proceed for IDIOPATHIC INTRACRANIAL HYPERTENSION or other evaluation.  Reviewed notes, labs and imaging from outside physicians, which showed:  FINDINGS: CT HEAD FINDINGS   Brain: Normal appearing cerebral hemispheres and posterior fossa structures. Normal size and position of the ventricles. No intracranial hemorrhage, mass lesion or CT evidence of acute infarction.   Vascular: No hyperdense vessel or unexpected calcification.   Skull: Normal. Negative for fracture or focal lesion.   Other: None.   CT ORBITS FINDINGS   Orbits: No traumatic or inflammatory finding. Globes, optic nerves, orbital fat, extraocular muscles, vascular structures, and lacrimal glands are normal.   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 No sinus expansion or bone thickening. The remainder of the paranasal sinuses are normally pneumatized.   Soft tissues: Unremarkable.   IMPRESSION: 1. Right sphenoid sinus retention cysts almost  completely filling the sinus with no sinus expansion or bone thickening. 2. Otherwise, normal head and orbit CTs.  Review of Systems: Patient complains of symptoms per HPI as well as the following symptoms vision changes. Pertinent negatives and positives per HPI. All others negative.   Social History   Socioeconomic History   Marital status: Married    Spouse name: Not on file   Number of children: Not on file   Years of education: Not on file   Highest education level: Not on file  Occupational History   Not on file  Tobacco Use   Smoking status: Never   Smokeless tobacco: Never  Substance and Sexual Activity   Alcohol use: No   Drug use: No   Sexual activity: Yes    Birth control/protection: Surgical  Other Topics Concern   Not on file  Social History Narrative   Not on file   Social Determinants of Health   Financial Resource Strain: Not on file  Food Insecurity: Not on file  Transportation Needs: Not on file  Physical Activity: Not on file  Stress: Not on file  Social Connections: Not on file  Intimate Partner Violence: Not on file    Family History  Problem Relation Age of Onset   Kidney disease Mother    Heart failure Mother    Diabetes Mother        Type I   Stroke Mother    Breast cancer Mother    Diabetes Father    Heart attack Father    Stroke Father    Aneurysm Paternal Aunt        Brain   Stroke Paternal Aunt  Cancer Paternal Uncle        lung   Aneurysm Paternal Aunt        brain   Stroke Paternal Aunt     Past Medical History:  Diagnosis Date   Diabetes mellitus without complication ([place]) 68/07/7516   Fibromyalgia    Hypertension    Hypothyroidism    Sleep apnea    Thyroid disease     Patient Active Problem List   Diagnosis Date Noted   Personal history of noncompliance with medical treatment, presenting hazards to health 03/16/2017   Hyperlipidemia 12/03/2015   Type 2 diabetes mellitus without complication ([place]) 00/17/4944    Thyroid activity decreased 09/17/2015   Essential hypertension, benign 09/17/2015   Chronic pain 09/17/2015   Morbid obesity ([place]) 09/17/2015    Past Surgical History:  Procedure Laterality Date   ABDOMINAL HYSTERECTOMY     ACHILLES TENDON REPAIR Right    CARDIAC CATHETERIZATION     CESAREAN SECTION  1996   CHOLECYSTECTOMY     COLON SURGERY  2013   perfed diverticuli   COLONOSCOPY WITH PROPOFOL N/A 07/24/2021   Procedure: COLONOSCOPY WITH PROPOFOL;  Surgeon: Harvel Quale, MD;  [facility]: [facility];  Service: Gastroenterology;  Laterality: N/A;  10:15   HERNIA REPAIR  2014   abd wall   LAPAROSCOPIC ENDOMETRIOSIS FULGURATION     two laparoscopies   PARTIAL HYSTERECTOMY     PLANTAR FASCIA SURGERY Bilateral    POLYPECTOMY  07/24/2021   Procedure: POLYPECTOMY;  Surgeon: Montez Morita, Quillian Quince, MD;  Location: [facility];  Service: Gastroenterology;;    Current Outpatient Medications  Medication Sig Dispense Refill   ADVAIR DISKUS 500-50 MCG/ACT AEPB Inhale 1 puff into the lungs in the morning and at bedtime.     atenolol (TENORMIN) 25 MG tablet Take 1 tablet (25 mg total) by mouth daily. 30 tablet 2   carisoprodol (SOMA) 350 MG tablet Take 350 mg by mouth daily.     furosemide (LASIX) 40 MG tablet Take 40 mg by mouth.     gabapentin (NEURONTIN) 300 MG capsule Take 300 mg by mouth 3 (three) times daily.     levothyroxine (SYNTHROID) 137 MCG tablet Take 137 mcg by mouth daily before breakfast.     OZEMPIC, 0.25 OR 0.5 MG/DOSE, 2 MG/3ML SOPN Inject 0.5 mg into the skin once a week.     traMADol (ULTRAM) 50 MG tablet Take 50 mg by mouth 2 (two) times daily as needed for moderate pain.     venlafaxine XR (EFFEXOR-XR) 150 MG 24 hr capsule Take 150 mg by mouth daily with breakfast.     VENTOLIN HFA 108 (90 Base) MCG/ACT inhaler Inhale 1-2 puffs into the lungs as needed for shortness of breath or wheezing.     vitamin B-12 (CYANOCOBALAMIN) 500 MCG tablet Take 500 mcg  by mouth daily.     No current facility-administered medications for this visit.    Allergies as of 04/21/2022 - Review Complete 04/21/2022  Allergen Reaction Noted   Statins Anaphylaxis, Shortness Of Breath, and Other (See Comments) 12/30/2016    Vitals: BP 112/73   Pulse 68   Ht '5\' 7"'$  (1.702 m)   Wt 244 lb 3.2 oz (110.8 kg)   BMI 38.25 kg/m  Last Weight:  Wt Readings from Last 1 Encounters:  04/21/22 244 lb 3.2 oz (110.8 kg)   Last Height:   Ht Readings from Last 1 Encounters:  04/21/22 '5\' 7"'$  (1.702 m)  Physical exam: Exam: Gen: NAD, conversant, well nourised, obese, well groomed                     CV: RRR, no MRG. No Carotid Bruits. No peripheral edema, warm, nontender Eyes: Conjunctivae clear without exudates or hemorrhage  Neuro: Detailed Neurologic Exam  Speech:    Speech is normal; fluent and spontaneous with normal comprehension.  Cognition:    The patient is oriented to person, place, and time;     recent and remote memory intact;     language fluent;     normal attention, concentration,     fund of knowledge Cranial Nerves:    The pupils are equal, round, and reactive to light. Optic nerve head edema OD and possibly OS. Right eye > left eye proptosis.  Visual fields are full to finger confrontation. Extraocular movements are intact except for left eye abduction. Trigeminal sensation is intact and the muscles of mastication are normal. The face is symmetric. The palate elevates in the midline. Hearing intact. Voice is normal. Shoulder shrug is normal. The tongue has normal motion without fasciculations.   Coordination:    Normal   Gait:    Heel-toe and tandem gait are normal.   Motor Observation:    No asymmetry, no atrophy, and no involuntary movements noted. Tone:    Normal muscle tone.    Posture:    Posture is normal. normal erect    Strength:    Strength is V/V in the upper and lower limbs.      Sensation: intact to LT     Reflex  Exam:  DTR's:    Absent AJs, otherwise Deep tendon reflexes in the upper and lower extremities are normal bilaterally.   Toes:    The toes are downgoing bilaterally.   Clonus:    Clonus is absent.      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 Need ENT asap and called will see her wednesdat be there at 10am.  She is out of the normal range for IDIOPATHIC INTRACRANIAL HYPERTENSION but can't rule it out and may need to continue to LP and CTV. PMHx obesity, HTN eye disease, DM2, HLD, neuropathy feet,   MRI of the brain and orbits stat  Dr. Benjamine Mola. Completely opacified right sphenoid sinus with right optic nerve head edema on the right, right eye proptosis and headache with Visi0n changes. Needs evaluate and treat. Happened after covid. Urgent given optic nerve head edema on the right and appearance of proptopic eye, vision changes and completely opacified sphenoid sinus on CT of the head and orbits(optic sinus mass?), need evaluation stat.   No evidence of thyroid eye disease on CT brain and orbits thankfully but will check labs  Per [facility] notes, no visual field compromise or diabetic eye disease however patient describes significant vision changes, again need MRI imaging and ENT evaluation stat  Right eye appears to be definite optic nerve head edema, left eye not as significant and could be hypertensive retinopathy can't rule out IDIOPATHIC INTRACRANIAL HYPERTENSION so will need CTV and possibly a lumbar puncture after above workup  After  evaluation with Dr. Benjamine Mola and MRI brain an orbits may need Lumbar puncture   for opening pressure and CTV if Dr. Benjamine Mola does not believe the completely opacific right sphenoid sinus is not the cause for the optic nerve head edema and other symptoms       Orders Placed This Encounter  Procedures   MR BRAIN W WO CONTRAST   MR ORBITS W WO CONTRAST   CBC with Differential/Platelets   Comprehensive metabolic panel   TSH Rfx on Abnormal to Free T4   Hemoglobin A1c   Thyroglobulin antibody   Thyroid peroxidase antibody   Ambulatory referral to ENT    Cc: Lisabeth Pick, MD,  Neale Burly, MD  Sarina Ill, MD  [facility] [address] [place], [place] [address]  Phone [phone] Fax [phone]  I spent over 90 minutes of face-to-face and non-face-to-face time with patient on the  1. Sphenoid mass, right   2. Optic nerve swelling   3. Chronic intractable headache, unspecified headache type   4. Ocular proptosis   5. Ophthalmoplegia   6. Vision changes   7. Optic nerve edema   8. Mass of right sphenoid sinus    diagnosis.  This included previsit chart review, lab review, study review, order entry, electronic health record documentation, patient education on the different diagnostic and therapeutic options, counseling and coordination of care, risks and benefits of management, compliance, or risk factor reduction

## 2022-04-21 NOTE — Patient Instructions (Addendum)
   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 Need ENT asap and will call.  She is out of the normal range for IDIOPATHIC INTRACRANIAL HYPERTENSION but can't rule it out.   MRI of the brain and orbits stat  Dr. Benjamine Mola. Completely opacified right sphenoid sinus with optic nerve head edema on the right, right eye proptosis and headache. Visi0n changes. Needs evaluate and treat. Happened after covid. Urgent given optic nerve head edema on the right and appearance of proptopic eye, vision changes and completely opacified sphenoid sinus on CT of the head and orbits, need evaluation stat.   No evidence of thyroid eye disease on CT brain and orbits thankfully  Per [employer] notes, no visual field compromise or diabetic eye disease however patient describes significant vision changes, again need MRI imaging and ENT evaluation stat  Right eye appears to be definite optic nerve head edema, left eye not as significant and could be hypertensive retinopathy can't rule out IDIOPATHIC INTRACRANIAL HYPERTENSION so will need MRV and possibly a lumbar puncture  After evaluation with Dr. Benjamine Mola and MRI brain an orbits and MRV may need Lumbar puncture  for opening pressure if Dr. Benjamine Mola does not believe the completely opacific right sphenoid sinus is not the cause for the optic nerve head edema and other symptoms

## 2022-04-21 NOTE — Telephone Encounter (Signed)
Craig Staggers:  [id number] exp. 04/21/22-05/21/22 medicaid NPR sent to GI

## 2022-04-22 ENCOUNTER — Ambulatory Visit
Admission: RE | Admit: 2022-04-22 | Discharge: 2022-04-22 | Disposition: A | Discharge status: Home / Self Care | Payer: Medicare HMO | Source: Ambulatory Visit | Attending: Neurology | Admitting: Neurology

## 2022-04-22 ENCOUNTER — Telehealth: Payer: Self-pay | Admitting: Neurology

## 2022-04-22 DIAGNOSIS — J3489 Other specified disorders of nose and nasal sinuses: Secondary | ICD-10-CM

## 2022-04-22 DIAGNOSIS — G8929 Other chronic pain: Secondary | ICD-10-CM

## 2022-04-22 DIAGNOSIS — H4930 Total (external) ophthalmoplegia, unspecified eye: Secondary | ICD-10-CM | POA: Diagnosis not present

## 2022-04-22 DIAGNOSIS — H0520 Unspecified exophthalmos: Secondary | ICD-10-CM

## 2022-04-22 DIAGNOSIS — H4710 Unspecified papilledema: Secondary | ICD-10-CM

## 2022-04-22 DIAGNOSIS — H539 Unspecified visual disturbance: Secondary | ICD-10-CM

## 2022-04-22 DIAGNOSIS — H499 Unspecified paralytic strabismus: Secondary | ICD-10-CM

## 2022-04-22 LAB — COMPREHENSIVE METABOLIC PANEL
ALT: 21 IU/L (ref 0–32)
AST: 16 IU/L (ref 0–40)
Albumin/Globulin Ratio: 1.8 (ref 1.2–2.2)
Albumin: 4.7 g/dL (ref 3.8–4.9)
Alkaline Phosphatase: 93 IU/L (ref 44–121)
BUN/Creatinine Ratio: 30 — ABNORMAL HIGH (ref 9–23)
BUN: 27 mg/dL — ABNORMAL HIGH (ref 6–24)
Bilirubin Total: 0.3 mg/dL (ref 0.0–1.2)
CO2: 24 mmol/L (ref 20–29)
Calcium: 10.9 mg/dL — ABNORMAL HIGH (ref 8.7–10.2)
Chloride: 98 mmol/L (ref 96–106)
Creatinine, Ser: 0.91 mg/dL (ref 0.57–1.00)
Globulin, Total: 2.6 g/dL (ref 1.5–4.5)
Glucose: 105 mg/dL — ABNORMAL HIGH (ref 70–99)
Potassium: 4.6 mmol/L (ref 3.5–5.2)
Sodium: 141 mmol/L (ref 134–144)
Total Protein: 7.3 g/dL (ref 6.0–8.5)
eGFR: 76 mL/min/{1.73_m2} (ref >59)

## 2022-04-22 LAB — CBC WITH DIFFERENTIAL/PLATELET
Basophils Absolute: 0 10*3/uL (ref 0.0–0.2)
Basos: 0 %
EOS (ABSOLUTE): 0.1 10*3/uL (ref 0.0–0.4)
Eos: 1 %
Hematocrit: 46.4 % (ref 34.0–46.6)
Hemoglobin: 15.6 g/dL (ref 11.1–15.9)
Immature Grans (Abs): 0.1 10*3/uL (ref 0.0–0.1)
Immature Granulocytes: 1 %
Lymphocytes Absolute: 4.7 10*3/uL — ABNORMAL HIGH (ref 0.7–3.1)
Lymphs: 32 %
MCH: 28.5 pg (ref 26.6–33.0)
MCHC: 33.6 g/dL (ref 31.5–35.7)
MCV: 85 fL (ref 79–97)
Monocytes Absolute: 1.1 10*3/uL — ABNORMAL HIGH (ref 0.1–0.9)
Monocytes: 8 %
Neutrophils Absolute: 8.9 10*3/uL — ABNORMAL HIGH (ref 1.4–7.0)
Neutrophils: 58 %
Platelets: 289 10*3/uL (ref 150–450)
RBC: 5.47 x10E6/uL — ABNORMAL HIGH (ref 3.77–5.28)
RDW: 13.6 % (ref 11.7–15.4)
WBC: 14.9 10*3/uL — ABNORMAL HIGH (ref 3.4–10.8)

## 2022-04-22 LAB — TSH RFX ON ABNORMAL TO FREE T4: TSH: 0.375 u[IU]/mL — ABNORMAL LOW (ref 0.450–4.500)

## 2022-04-22 LAB — T4F: T4,Free (Direct): 1.79 ng/dL — ABNORMAL HIGH (ref 0.82–1.77)

## 2022-04-22 LAB — THYROGLOBULIN ANTIBODY: Thyroglobulin Antibody: 23.2 IU/mL — ABNORMAL HIGH (ref 0.0–0.9)

## 2022-04-22 LAB — HEMOGLOBIN A1C
Est. average glucose Bld gHb Est-mCnc: 131 mg/dL
Hgb A1c MFr Bld: 6.2 % — ABNORMAL HIGH (ref 4.8–5.6)

## 2022-04-22 LAB — THYROID PEROXIDASE ANTIBODY: Thyroperoxidase Ab SerPl-aCnc: 259 IU/mL — ABNORMAL HIGH (ref 0–34)

## 2022-04-22 MED ORDER — GADOBENATE DIMEGLUMINE 529 MG/ML IV SOLN
20.0000 mL | Freq: Once | INTRAVENOUS | Status: AC | PRN
  Administered 2022-04-22: 20 mL via INTRAVENOUS

## 2022-04-22 NOTE — Telephone Encounter (Signed)
Referral sent to Dr. Teoh [phone]. ?

## 2022-04-23 DIAGNOSIS — R519 Headache, unspecified: Secondary | ICD-10-CM | POA: Diagnosis not present

## 2022-04-23 DIAGNOSIS — J310 Chronic rhinitis: Secondary | ICD-10-CM | POA: Diagnosis not present

## 2022-04-23 DIAGNOSIS — J338 Other polyp of sinus: Secondary | ICD-10-CM | POA: Diagnosis not present

## 2022-04-23 DIAGNOSIS — J323 Chronic sphenoidal sinusitis: Secondary | ICD-10-CM | POA: Diagnosis not present

## 2022-04-24 ENCOUNTER — Telehealth: Payer: Self-pay | Admitting: *Deleted

## 2022-04-24 NOTE — Telephone Encounter (Signed)
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 Can you call patient and ask if she is ok with proceeding with spinal tap and imaging of the veins of the head as we discussed? If so let me know so I can order. Also MRI did not show any signs of thyroid eye disease thanks. Thanks dr Jaynee Eagles

## 2022-04-24 NOTE — Telephone Encounter (Signed)
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 Will go proceed with LP and imaging of vessels in head if pt allows.  Pt was ok to do this.  Also MRI did show no thyroid disease.  Pt did see ENT and will have another CT paranasal sinuses to evalute the sphenoid sinus cyst. She verbalized understanding.

## 2022-04-28 ENCOUNTER — Ambulatory Visit (INDEPENDENT_AMBULATORY_CARE_PROVIDER_SITE_OTHER): Payer: Medicare HMO | Admitting: Neurology

## 2022-04-28 ENCOUNTER — Encounter: Payer: Self-pay | Admitting: Neurology

## 2022-04-28 VITALS — BP 119/74 | HR 74 | Ht 67.0 in | Wt 245.0 lb

## 2022-04-28 DIAGNOSIS — Z0000 Encounter for general adult medical examination without abnormal findings: Secondary | ICD-10-CM | POA: Diagnosis not present

## 2022-04-28 DIAGNOSIS — G8929 Other chronic pain: Secondary | ICD-10-CM | POA: Diagnosis not present

## 2022-04-28 DIAGNOSIS — E079 Disorder of thyroid, unspecified: Secondary | ICD-10-CM

## 2022-04-28 DIAGNOSIS — R519 Headache, unspecified: Secondary | ICD-10-CM

## 2022-04-28 DIAGNOSIS — G932 Benign intracranial hypertension: Secondary | ICD-10-CM

## 2022-04-28 DIAGNOSIS — H4711 Papilledema associated with increased intracranial pressure: Secondary | ICD-10-CM

## 2022-04-28 DIAGNOSIS — E1142 Type 2 diabetes mellitus with diabetic polyneuropathy: Secondary | ICD-10-CM

## 2022-04-28 MED ORDER — ACETAZOLAMIDE ER 500 MG PO CP12: 500.0000 mg | ORAL_CAPSULE | Freq: Two times a day (BID) | ORAL | 6 refills | Status: AC

## 2022-04-28 NOTE — Progress Notes (Signed)
[facility]   Provider:  Dr Jaynee Eagles Requesting Provider: Neale Burly, MD Primary Care Provider:  Neale Burly, MD  CC:  IDIOPATHIC INTRACRANIAL HYPERTENSION and sphenoid sinusitis  04/28/2022: Patient with subacute headache, complete opacification of right sphenoid sinus, optic nerve edema on the right > left  Discussed: IDIOPATHIC INTRACRANIAL HYPERTENSION   Weight loss is critical, discussed weight loss and risk of permanent vision loss  MRI of the brain and orbits stat: No thyroid eye disease, empty sella, no tumor of sphenoid sinus but cyst and Dr. 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 Benjamine Mola: evaluating sphenoid sinusitis, dr. Benjamine Mola is getting approval for surgical intervention  Per [facility] Deaner notes, no visual field compromise or diabetic eye disease   After LP(spinal tap) and feeling better ( no post-LP headache) start Diamox  Follow up with Herbert Deaner for regular eye exams  Called kathy at [facility] to schedule LP  TSH .375, thyroid peroxidase antibodies 259 elevated, thyroglobulin antibodies 23.2 elevated, T4F 1.79: send to endocrinology? WBCs elevated, will send to primary care   IMPRESSION: reviewed images and agree, spent additional 30 minutes outside appt talking to neiuroradiology dr Windell Norfolk as well as calling [facility] and dr Deeann Saint office 1. Partially empty sella, prominence of the bilateral optic nerve root sleeves, and effaced appearance of the transverse/sigmoid sinus junctions - raising the possibility of idiopathic intracranial hypertension (pseudotumor cerebri). CSF opening pressure measurement would evaluate further.   2. Otherwise negative MRI appearance of the Orbits. No extraocular muscle enlargement to suggest  thyroid eye disease. And no evidence of optic neuritis.   3. No other acute intracranial abnormality. Evidence of a tiny 5 mm right frontal convexity meningioma, likely clinically silent.   4. Right sphenoid sinus inflammation.   Patient complains of symptoms per HPI as well as the following symptoms: headaches, no changes in vision . Pertinent negatives and positives per HPI. All others negative:   HPI:  Denise Daniels is a 51 y.o. female here as requested by Neale Burly, MD for possible optic nerve head edema. PMHx Diabetes, HTN, HTN eye disease, neuropathy feet.  She had a CT that showed right sinus sphenoid completed opacified which can be causing a lot of issues including headaches. She is having headaches, significant vision changes right, eye proptosis. Multiple other symptoms but at this time need to focus and try to get her to ENT asap to see if sphenoid sinus could be causing these issues or if we need to proceed for IDIOPATHIC INTRACRANIAL HYPERTENSION or other evaluation.  Reviewed notes, labs and imaging from outside physicians, which showed:  FINDINGS: CT HEAD FINDINGS   Brain: Normal appearing cerebral hemispheres and posterior fossa structures. Normal size and position of the ventricles. No intracranial hemorrhage, mass lesion or CT evidence of acute infarction.   Vascular: No hyperdense vessel or unexpected calcification.   Skull: Normal. Negative for fracture or focal lesion.   Other: None.   CT ORBITS FINDINGS   Orbits: No traumatic or inflammatory finding. Globes, optic nerves, orbital fat, extraocular muscles, vascular structures, and lacrimal glands are normal.   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 No sinus expansion or bone thickening. The remainder of the paranasal sinuses are normally pneumatized.   Soft  tissues: Unremarkable.   IMPRESSION: 1. Right sphenoid sinus retention cysts almost  completely filling the sinus with no sinus expansion or bone thickening. 2. Otherwise, normal head and orbit CTs.  Review of Systems: Patient complains of symptoms per HPI as well as the following symptoms vision changes. Pertinent negatives and positives per HPI. All others negative.   Social History   Socioeconomic History   Marital status: Married    Spouse name: Not on file   Number of children: Not on file   Years of education: Not on file   Highest education level: Not on file  Occupational History   Not on file  Tobacco Use   Smoking status: Never   Smokeless tobacco: Never  Substance and Sexual Activity   Alcohol use: No   Drug use: No   Sexual activity: Yes    Birth control/protection: Surgical  Other Topics Concern   Not on file  Social History Narrative   Not on file   Social Determinants of Health   Financial Resource Strain: Not on file  Food Insecurity: Not on file  Transportation Needs: Not on file  Physical Activity: Not on file  Stress: Not on file  Social Connections: Not on file  Intimate Partner Violence: Not on file    Family History  Problem Relation Age of Onset   Kidney disease Mother    Heart failure Mother    Diabetes Mother        Type I   Stroke Mother    Breast cancer Mother    Diabetes Father    Heart attack Father    Stroke Father    Aneurysm Paternal Aunt        Brain   Stroke Paternal Aunt    Cancer Paternal Uncle        lung   Aneurysm Paternal Aunt        brain   Stroke Paternal Aunt     Past Medical History:  Diagnosis Date   Diabetes mellitus without complication ([place]) 20/35/5974   Fibromyalgia    Hypertension    Hypothyroidism    Sleep apnea    Thyroid disease     Patient Active Problem List   Diagnosis Date Noted   Personal history of noncompliance with medical treatment, presenting hazards to health 03/16/2017   Hyperlipidemia 12/03/2015   Type 2 diabetes mellitus without complication ([place]) 16/38/4536    Thyroid activity decreased 09/17/2015   Essential hypertension, benign 09/17/2015   Chronic pain 09/17/2015   Morbid obesity ([place]) 09/17/2015    Past Surgical History:  Procedure Laterality Date   ABDOMINAL HYSTERECTOMY     ACHILLES TENDON REPAIR Right    CARDIAC CATHETERIZATION     CESAREAN SECTION  1996   CHOLECYSTECTOMY     COLON SURGERY  2013   perfed diverticuli   COLONOSCOPY WITH PROPOFOL N/A 07/24/2021   Procedure: COLONOSCOPY WITH PROPOFOL;  Surgeon: Harvel Quale, MD;  [facility]: [facility];  Service: Gastroenterology;  Laterality: N/A;  10:15   HERNIA REPAIR  2014   abd wall   LAPAROSCOPIC ENDOMETRIOSIS FULGURATION     two laparoscopies   PARTIAL HYSTERECTOMY     PLANTAR FASCIA SURGERY Bilateral    POLYPECTOMY  07/24/2021   Procedure: POLYPECTOMY;  Surgeon: Montez Morita, Quillian Quince, MD;  Location: [facility];  Service: Gastroenterology;;    Current Outpatient Medications  Medication Sig Dispense Refill   acetaZOLAMIDE ER (DIAMOX) 500 MG capsule Take 1 capsule (500 mg total) by  mouth 2 (two) times daily. 60 capsule 6   ADVAIR DISKUS 500-50 MCG/ACT AEPB Inhale 1 puff into the lungs in the morning and at bedtime.     atenolol (TENORMIN) 25 MG tablet Take 1 tablet (25 mg total) by mouth daily. 30 tablet 2   carisoprodol (SOMA) 350 MG tablet Take 350 mg by mouth daily.     furosemide (LASIX) 40 MG tablet Take 40 mg by mouth.     gabapentin (NEURONTIN) 300 MG capsule Take 300 mg by mouth 3 (three) times daily.     levothyroxine (SYNTHROID) 137 MCG tablet Take 137 mcg by mouth daily before breakfast.     OZEMPIC, 0.25 OR 0.5 MG/DOSE, 2 MG/3ML SOPN Inject 0.5 mg into the skin once a week.     traMADol (ULTRAM) 50 MG tablet Take 50 mg by mouth 2 (two) times daily as needed for moderate pain.     venlafaxine XR (EFFEXOR-XR) 150 MG 24 hr capsule Take 150 mg by mouth daily with breakfast.     VENTOLIN HFA 108 (90 Base) MCG/ACT inhaler Inhale 1-2 puffs into  the lungs as needed for shortness of breath or wheezing.     vitamin B-12 (CYANOCOBALAMIN) 500 MCG tablet Take 500 mcg by mouth daily.     No current facility-administered medications for this visit.    Allergies as of 04/28/2022 - Review Complete 04/28/2022  Allergen Reaction Noted   Statins Anaphylaxis, Shortness Of Breath, and Other (See Comments) 12/30/2016    Vitals: BP 119/74 (BP Location: Left Arm, Patient Position: Sitting, Cuff Size: Large)   Pulse 74   Ht 5' 7" (1.702 m)   Wt 245 lb (111.1 kg)   BMI 38.37 kg/m  Last Weight:  Wt Readings from Last 1 Encounters:  04/28/22 245 lb (111.1 kg)   Last Height:   Ht Readings from Last 1 Encounters:  04/28/22 5' 7" (1.702 m)     Physical exam: stable Exam: Gen: NAD, conversant, well nourised, obese, well groomed                     CV: RRR, no MRG. No Carotid Bruits. No peripheral edema, warm, nontender Eyes: Conjunctivae clear without exudates or hemorrhage  Neuro: stable Detailed Neurologic Exam  Speech:    Speech is normal; fluent and spontaneous with normal comprehension.  Cognition:    The patient is oriented to person, place, and time;     recent and remote memory intact;     language fluent;     normal attention, concentration,     fund of knowledge Cranial Nerves:    The pupils are equal, round, and reactive to light. Optic nerve head edema OD and possibly OS. Right eye > left eye proptosis.  Visual fields are full to finger confrontation. Extraocular movements are intact except for left eye abduction. Trigeminal sensation is intact and the muscles of mastication are normal. The face is symmetric. The palate elevates in the midline. Hearing intact. Voice is normal. Shoulder shrug is normal. The tongue has normal motion without fasciculations.   Coordination:    Normal   Gait:    Heel-toe and tandem gait are normal.   Motor Observation:    No asymmetry, no atrophy, and no involuntary movements  noted. Tone:    Normal muscle tone.    Posture:    Posture is normal. normal erect    Strength:    Strength is V/V in the upper and lower limbs.  Sensation: intact to LT     Reflex Exam:  DTR's:    Absent AJs, otherwise Deep tendon reflexes in the upper and lower extremities are normal bilaterally.   Toes:    The toes are downgoing bilaterally.   Clonus:    Clonus is absent.      Assessment/Plan:  Patient with subacute headache, complete opacification of right sphenoid sinus, optic nerve edema on the right > left  MRI of the brain and orbits stat: No thyroid eye disease, empty sella, no tumor of sphenoid sinus but cyst and Dr. 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 Benjamine Mola: evaluating sphenoid sinusitis, dr. Benjamine Mola is getting approval for surgical intervention  Per Herbert Deaner notes, no visual field compromise or diabetic eye disease   After LP(spinal tap) and feeling better ( no post-LP headache) start Diamox  Follow up with Herbert Deaner for regular eye exams  Weight loss is critical, discussed weight loss and risk of permanent vision loss  Called kathy at [facility] imaging to schedule LP  TSH .375, thyroid peroxidase antibodies 259 elevated, thyroglobulin antibodies 23.2 elevated, T4F 1.79: send to endocrinology? WBCs elevated, will send to primary care  for this and other lab value abnormalities  Neuropathy: come back for follow up just for neuropathy  Recent Results (from the past 2160 hour(s))  CBC with Differential/Platelets     Status: Abnormal   Collection Time: 04/21/22  2:42 PM  Result Value Ref Range   WBC 14.9 (H) 3.4 - 10.8 x10E3/uL   RBC 5.47 (H) 3.77 - 5.28 x10E6/uL   Hemoglobin 15.6 11.1 - 15.9 g/dL   Hematocrit 46.4 34.0 - 46.6 %   MCV 85 79 - 97 fL    MCH 28.5 26.6 - 33.0 pg   MCHC 33.6 31.5 - 35.7 g/dL   RDW 13.6 11.7 - 15.4 %   Platelets 289 150 - 450 x10E3/uL   Neutrophils 58 Not Estab. %   Lymphs 32 Not Estab. %   Monocytes 8 Not Estab. %   Eos 1 Not Estab. %   Basos 0 Not Estab. %   Neutrophils Absolute 8.9 (H) 1.4 - 7.0 x10E3/uL   Lymphocytes Absolute 4.7 (H) 0.7 - 3.1 x10E3/uL   Monocytes Absolute 1.1 (H) 0.1 - 0.9 x10E3/uL   EOS (ABSOLUTE) 0.1 0.0 - 0.4 x10E3/uL   Basophils Absolute 0.0 0.0 - 0.2 x10E3/uL   Immature Granulocytes 1 Not Estab. %   Immature Grans (Abs) 0.1 0.0 - 0.1 x10E3/uL  Comprehensive metabolic panel     Status: Abnormal   Collection Time: 04/21/22  2:42 PM  Result Value Ref Range   Glucose 105 (H) 70 - 99 mg/dL   BUN 27 (H) 6 - 24 mg/dL   Creatinine, Ser 0.91 0.57 - 1.00 mg/dL   eGFR 76 >59 mL/min/1.73   BUN/Creatinine Ratio 30 (H) 9 - 23   Sodium 141 134 - 144 mmol/L   Potassium 4.6 3.5 - 5.2 mmol/L   Chloride 98 96 - 106 mmol/L   CO2 24 20 - 29 mmol/L   Calcium 10.9 (H) 8.7 - 10.2 mg/dL   Total Protein 7.3 6.0 - 8.5 g/dL   Albumin 4.7 3.8 - 4.9 g/dL   Globulin, Total 2.6 1.5 - 4.5 g/dL   Albumin/Globulin Ratio 1.8 1.2 - 2.2  Bilirubin Total 0.3 0.0 - 1.2 mg/dL   Alkaline Phosphatase 93 44 - 121 IU/L   AST 16 0 - 40 IU/L   ALT 21 0 - 32 IU/L  TSH Rfx on Abnormal to Free T4     Status: Abnormal   Collection Time: 04/21/22  2:42 PM  Result Value Ref Range   TSH 0.375 (L) 0.450 - 4.500 uIU/mL  Hemoglobin A1c     Status: Abnormal   Collection Time: 04/21/22  2:42 PM  Result Value Ref Range   Hgb A1c MFr Bld 6.2 (H) 4.8 - 5.6 %    Comment:          Prediabetes: 5.7 - 6.4          Diabetes: >6.4          Glycemic control for adults with diabetes: <7.0    Est. average glucose Bld gHb Est-mCnc 131 mg/dL  Thyroglobulin antibody     Status: Abnormal   Collection Time: 04/21/22  2:42 PM  Result Value Ref Range   Thyroglobulin Antibody 23.2 (H) 0.0 - 0.9 IU/mL    Comment: Thyroglobulin  Antibody measured by Beckman Coulter Methodology  Thyroid peroxidase antibody     Status: Abnormal   Collection Time: 04/21/22  2:42 PM  Result Value Ref Range   Thyroperoxidase Ab SerPl-aCnc 259 (H) 0 - 34 IU/mL  T4F     Status: Abnormal   Collection Time: 04/21/22  2:42 PM  Result Value Ref Range   T4,Free (Direct) 1.79 (H) 0.82 - 1.77 ng/dL     Orders Placed This Encounter  Procedures   CT VENOGRAM HEAD   DG FLUORO GUIDED LOC OF NEEDLE/CATH TIP FOR SPINAL INJECT LT   Ambulatory Referral to Primary Care   Meds ordered this encounter  Medications   acetaZOLAMIDE ER (DIAMOX) 500 MG capsule    Sig: Take 1 capsule (500 mg total) by mouth 2 (two) times daily.    Dispense:  60 capsule    Refill:  6    Cc: Hasanaj, Samul Dada, MD,  Sherrie Sport Samul Dada, MD  Sarina Ill, MD  [facility] [address] [place], [place] [address]  Phone [phone] Fax [phone] I spent over 75 minutes of face-to-face and non-face-to-face time with patient on the  1. IIH (idiopathic intracranial hypertension)   2. Chronic intractable headache, unspecified headache type   3. 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 Thyroid disease   5. General medical examination   6. Type 2 diabetes mellitus with diabetic polyneuropathy, unspecified whether long term insulin use (HCC)    diagnosis.  This included previsit chart review, lab review, study review, order entry, electronic health record documentation, patient education on the different diagnostic and therapeutic options, counseling and coordination of care, risks and benefits of management, compliance, or risk factor reduction

## 2022-04-28 NOTE — Patient Instructions (Addendum)
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 Benjamine Mola: evaluating sphenoid sinusitis, dr. Benjamine Mola is getting approval for surgical intervention  Per Herbert Deaner notes, no visual field compromise or diabetic eye disease   After LP(spinal tap) and feeling better ( no post-LP headache) start Diamox  Follow up with Herbert Deaner for regular eye exams  Called kathy at [facility] imaging to schedule LP  TSH .375, thyroid peroxidase antibodies 259 elevated, thyroglobulin antibodies 23.2 elevated, T4F 1.79: send to endocrinology? WBCs elevated, will send to primary care for this and other lab value abnormalities  Recent Results (from the past 2160 hour(s))  CBC with Differential/Platelets     Status: Abnormal   Collection Time: 04/21/22  2:42 PM  Result Value Ref Range   WBC 14.9 (H) 3.4 - 10.8 x10E3/uL   RBC 5.47 (H) 3.77 - 5.28 x10E6/uL   Hemoglobin 15.6 11.1 - 15.9 g/dL   Hematocrit 46.4 34.0 - 46.6 %   MCV 85 79 - 97 fL   MCH 28.5 26.6 - 33.0 pg   MCHC 33.6 31.5 - 35.7 g/dL   RDW 13.6 11.7 - 15.4 %   Platelets 289 150 - 450 x10E3/uL   Neutrophils 58 Not Estab. %   Lymphs 32 Not Estab. %   Monocytes 8 Not Estab. %   Eos 1 Not Estab. %   Basos 0 Not Estab. %   Neutrophils Absolute 8.9 (H) 1.4 - 7.0 x10E3/uL    Lymphocytes Absolute 4.7 (H) 0.7 - 3.1 x10E3/uL   Monocytes Absolute 1.1 (H) 0.1 - 0.9 x10E3/uL   EOS (ABSOLUTE) 0.1 0.0 - 0.4 x10E3/uL   Basophils Absolute 0.0 0.0 - 0.2 x10E3/uL   Immature Granulocytes 1 Not Estab. %   Immature Grans (Abs) 0.1 0.0 - 0.1 x10E3/uL  Comprehensive metabolic panel     Status: Abnormal   Collection Time: 04/21/22  2:42 PM  Result Value Ref Range   Glucose 105 (H) 70 - 99 mg/dL   BUN 27 (H) 6 - 24 mg/dL   Creatinine, Ser 0.91 0.57 - 1.00 mg/dL   eGFR 76 >59 mL/min/1.73   BUN/Creatinine Ratio 30 (H) 9 - 23   Sodium 141 134 - 144 mmol/L   Potassium 4.6 3.5 - 5.2 mmol/L   Chloride 98 96 - 106 mmol/L   CO2 24 20 - 29 mmol/L   Calcium 10.9 (H) 8.7 - 10.2 mg/dL   Total Protein 7.3 6.0 - 8.5 g/dL   Albumin 4.7 3.8 - 4.9 g/dL   Globulin, Total 2.6 1.5 - 4.5 g/dL   Albumin/Globulin Ratio 1.8 1.2 - 2.2   Bilirubin Total 0.3 0.0 - 1.2 mg/dL   Alkaline Phosphatase 93 44 - 121 IU/L   AST 16 0 - 40 IU/L   ALT 21 0 - 32 IU/L  TSH Rfx on Abnormal to Free T4  Status: Abnormal   Collection Time: 04/21/22  2:42 PM  Result Value Ref Range   TSH 0.375 (L) 0.450 - 4.500 uIU/mL  Hemoglobin A1c     Status: Abnormal   Collection Time: 04/21/22  2:42 PM  Result Value Ref Range   Hgb A1c MFr Bld 6.2 (H) 4.8 - 5.6 %    Comment:          Prediabetes: 5.7 - 6.4          Diabetes: >6.4          Glycemic control for adults with diabetes: <7.0    Est. average glucose Bld gHb Est-mCnc 131 mg/dL  Thyroglobulin antibody     Status: Abnormal   Collection Time: 04/21/22  2:42 PM  Result Value Ref Range   Thyroglobulin Antibody 23.2 (H) 0.0 - 0.9 IU/mL    Comment: Thyroglobulin Antibody measured by [facility] Methodology  Thyroid peroxidase antibody     Status: Abnormal   Collection Time: 04/21/22  2:42 PM  Result Value Ref Range   Thyroperoxidase Ab SerPl-aCnc 259 (H) 0 - 34 IU/mL  T4F     Status: Abnormal   Collection Time: 04/21/22  2:42 PM  Result Value Ref Range    T4,Free (Direct) 1.79 (H) 0.82 - 1.77 ng/dL     To prevent or relieve headaches, try the following: Cool Compress. Lie down and place a cool compress on your head.  Avoid headache triggers. If certain foods or odors seem to have triggered your migraines in the past, avoid them. A headache diary might help you identify triggers.  Include physical activity in your daily routine. Try a daily walk or other moderate aerobic exercise.  Manage stress. Find healthy ways to cope with the stressors, such as delegating tasks on your to-do list.  Practice relaxation techniques. Try deep breathing, yoga, massage and visualization.  Eat regularly. Eating regularly scheduled meals and maintaining a healthy diet might help prevent headaches. Also, drink plenty of fluids.  Follow a regular sleep schedule. Sleep deprivation might contribute to headaches Consider biofeedback. With this mind-body technique, you learn to control certain bodily functions -- such as muscle tension, heart rate and blood pressure -- to prevent headaches or reduce headache pain.    Proceed to emergency room if you experience new or worsening symptoms or symptoms do not resolve, if you have new neurologic symptoms or if headache is severe, or for any concerning symptom.   Provided education and documentation from American headache Society toolbox including articles on: pseudotumoer cerebri(IIH), chronic migraine medication overuse headache, chronic migraines, prevention of migraines and other headaches, behavioral and other nonpharmacologic treatments for headache.   Orders Placed This Encounter  Procedures   CT VENOGRAM HEAD   DG FLUORO GUIDED LOC OF NEEDLE/CATH TIP FOR SPINAL INJECT LT     Idiopathic Intracranial Hypertension  Idiopathic intracranial hypertension (IIH) is a condition that increases pressure around the brain. The fluid that surrounds the brain and spinal cord (cerebrospinal fluid, or CSF) increases and causes  the pressure. Idiopathic means that the cause of this condition is not known. IIH affects the brain and spinal cord (neurological disorder). If this condition is not treated, it can cause vision loss or blindness. What are the causes? The cause of this condition is not known. What increases the risk? The following factors may make you more likely to develop this condition: Being very overweight (obese). Being a female between the ages of 82 and 37 years old, who  has not gone through menopause. Taking certain medicines, such as birth control or steroids. What are the signs or symptoms? Symptoms of this condition include: Headaches. This is the most common symptom. Brief episodes of total blindness. Double vision, blurred vision, or poor side (peripheral) vision. Pain in the shoulders or neck. Nausea and vomiting. A sound like rushing water or a pulsing sound within the ears (pulsatile tinnitus), or ringing in the ears. How is this diagnosed? This condition may be diagnosed based on: Your symptoms and medical history. Imaging tests of the brain, such as: CT scan. MRI. Magnetic resonance venogram (MRV) to check the veins. Diagnostic lumbar puncture. This is a procedure to remove and examine a sample of cerebrospinal fluid. This procedure can determine whether too much fluid may be causing IIH. A thorough eye exam to check for swelling or nerve damage in the eyes. How is this treated? Treatment for this condition depends on the symptoms. The goal of treatment is to decrease the pressure around your brain. Common treatments include: Weight loss through healthy eating, salt restriction, and exercise, if you are overweight. Medicines to decrease the production of spinal fluid and lower the pressure within your skull. Medicines to prevent or treat headaches. Other treatments may include: Surgery to place drains (shunts) in your brain for removing excess fluid. Lumbar puncture to remove  excess cerebrospinal fluid. Follow these instructions at home: If you are overweight or obese, work with your health care provider to lose weight. Take over-the-counter and prescription medicines only as told by your health care provider. Ask your health care provider if the medicine prescribed to you requires you to avoid driving or using machinery. Do not use any products that contain nicotine or tobacco, such as cigarettes, e-cigarettes, and chewing tobacco. If you need help quitting, ask your health care provider. Keep all follow-up visits as told by your health care provider. This is important. Contact a health care provider if: You have changes in your vision, such as: Double vision. Blurred vision. Poor peripheral vision. Get help right away if: You have any of the following symptoms and they get worse or do not get better: Headaches. Nausea. Vomiting. Sudden trouble seeing. Summary Idiopathic intracranial hypertension (IIH) is a condition that increases pressure around the brain. The cause is not known (is idiopathic). The most common symptom of IIH is headaches. Vision changes, pain in the shoulders or neck, nausea, and vomiting may also occur. Treatment for this condition depends on your symptoms. The goal of treatment is to decrease the pressure around your brain. If you are overweight or obese, work with your health care provider to lose weight. Take over-the-counter and prescription medicines only as told by your health care provider. This information is not intended to replace advice given to you by your health care provider. Make sure you discuss any questions you have with your health care provider. Document Revised: 07/09/2019 Document Reviewed: 07/09/2019 Elsevier Patient Education  [place].   Acetazolamide Tablets What is this medication? ACETAZOLAMIDE (a set a ZOLE a mide) reduces swelling related to heart disease. It may also be used to reduce swelling  caused by medications. It helps your kidneys remove more fluid and salt from your blood through the urine. It may also be used to treat conditions with increased pressure of the eye, such as glaucoma. It can be used with other medications to prevent and control seizures in people with epilepsy. It can also be used to prevent or treat symptoms  of altitude sickness. It works by increasing the amount of oxygen in your body. It belongs to a group of medications called diuretics. This medicine may be used for other purposes; ask your health care provider or pharmacist if you have questions. COMMON BRAND NAME(S): Diamox What should I tell my care team before I take this medication? They need to know if you have any of these conditions: Glaucoma Kidney disease Liver disease Low adrenal gland function Lung or breathing disease An unusual or allergic reaction to acetazolamide, other medications, foods, dyes, or preservatives Pregnant or trying to get pregnant Breast-feeding How should I use this medication? Take this medication by mouth. Take it as directed on the prescription label at the same time every day. You can take it with or without food. If it upsets your stomach, take it with food. Keep taking it unless your care team tells you to stop. Talk to your care team about the use of this medication in children. Special care may be needed. Overdosage: If you think you have taken too much of this medicine contact a poison control center or emergency room at once. NOTE: This medicine is only for you. Do not share this medicine with others. What if I miss a dose? If you miss a dose, take it as soon as you can. If it is almost time for your next dose, take only that dose. Do not take double or extra doses. What may interact with this medication? Do not take this medication with any of the following: Methazolamide This medication may also interact with the following: Aspirin and aspirin-like  medications Cyclosporine Lithium Medication for diabetes Methenamine Other diuretics Phenytoin Primidone Quinidine Sodium bicarbonate Stimulant medications, such as dextroamphetamine This list may not describe all possible interactions. Give your health care provider a list of all the medicines, herbs, non-prescription drugs, or dietary supplements you use. Also tell them if you smoke, drink alcohol, or use illegal drugs. Some items may interact with your medicine. What should I watch for while using this medication? Visit your care team for regular checks on your progress. Tell your care team if your symptoms do not start to get better or if they get worse. This medication may cause serious skin reactions. They can happen weeks to months after starting the medication. Contact your care team right away if you notice fevers or flu-like symptoms with a rash. The rash may be red or purple and then turn into blisters or peeling of the skin. Or, you might notice a red rash with swelling of the face, lips, or lymph nodes in your neck or under your arms. This medication may affect your coordination, reaction time, or judgment. Do not drive or operate machinery until you know how this medication affects you. Sit up or stand slowly to reduce the risk of dizzy or fainting spells. What side effects may I notice from receiving this medication? Side effects that you should report to your care team as soon as possible: Allergic reactions--skin rash, itching, hives, swelling of the face, lips, tongue, or throat Aplastic anemia--unusual weakness or fatigue, dizziness, headache, trouble breathing, increased bleeding or bruising High acid level--trouble breathing, unusual weakness or fatigue, confusion, headache, fast or irregular heartbeat, nausea, vomiting Infection--fever, chills, cough, or sore throat Kidney stones--blood in the urine, pain or trouble passing urine, pain in the lower back or sides Liver  injury--right upper belly pain, loss of appetite, nausea, light-colored stool, dark yellow or brown urine, yellowing skin or eyes, unusual  weakness or fatigue Low potassium level--muscle pain or cramps, unusual weakness or fatigue, fast or irregular heartbeat, constipation Redness, blistering, peeling, or loosening of the skin, including inside the mouth Side effects that usually do not require medical attention (report to your care team if they continue or are bothersome): Blurry vision Change in taste Loss of appetite Pain, tingling, or numbness in the hands or feet This list may not describe all possible side effects. Call your doctor for medical advice about side effects. You may report side effects to FDA at 1-800-FDA-1088. Where should I keep my medication? Keep out of the reach of children and pets. Store at room temperature between 20 and 25 degrees C (68 and 77 degrees F). Throw away any unused medication after the expiration date. NOTE: This sheet is a summary. It may not cover all possible information. If you have questions about this medicine, talk to your doctor, pharmacist, or health care provider.  2023 Elsevier/Gold Standard (2021-06-11 00:00:00)

## 2022-04-29 ENCOUNTER — Other Ambulatory Visit: Payer: Self-pay | Admitting: Otolaryngology

## 2022-04-29 ENCOUNTER — Telehealth: Payer: Self-pay | Admitting: Neurology

## 2022-04-29 ENCOUNTER — Telehealth: Payer: Self-pay | Admitting: *Deleted

## 2022-04-29 DIAGNOSIS — J329 Chronic sinusitis, unspecified: Secondary | ICD-10-CM

## 2022-04-29 NOTE — Telephone Encounter (Signed)
-----   Message from Melvenia Beam, MD sent at 04/29/2022  1:50 PM EDT ----- Dr. Ronnald Ramp will see her, you can let patient know thanks  ----- Message ----- From: Janith Lima, MD Sent: 04/29/2022   1:07 PM EDT To: Melvenia Beam, MD  Yes, I will see her.  TJ  ----- Message ----- From: Melvenia Beam, MD Sent: 04/28/2022   3:11 PM EDT To: Janith Lima, MD  Dr. Ronnald Ramp, can you see this darling patient as her pcp? I'll refer her if you are taking newpatient thanks!

## 2022-04-29 NOTE — Telephone Encounter (Signed)
Denise Daniels: [id number] exp. 04/29/22-05/29/22 [place] [employer] NPR sent to GI

## 2022-04-30 ENCOUNTER — Telehealth: Payer: Self-pay | Admitting: Neurology

## 2022-04-30 DIAGNOSIS — M9901 Segmental and somatic dysfunction of cervical region: Secondary | ICD-10-CM | POA: Diagnosis not present

## 2022-04-30 DIAGNOSIS — M9902 Segmental and somatic dysfunction of thoracic region: Secondary | ICD-10-CM | POA: Diagnosis not present

## 2022-04-30 DIAGNOSIS — M542 Cervicalgia: Secondary | ICD-10-CM | POA: Diagnosis not present

## 2022-04-30 DIAGNOSIS — M62830 Muscle spasm of back: Secondary | ICD-10-CM | POA: Diagnosis not present

## 2022-04-30 DIAGNOSIS — M9903 Segmental and somatic dysfunction of lumbar region: Secondary | ICD-10-CM | POA: Diagnosis not present

## 2022-04-30 DIAGNOSIS — M546 Pain in thoracic spine: Secondary | ICD-10-CM | POA: Diagnosis not present

## 2022-04-30 NOTE — Telephone Encounter (Signed)
Referral for Primary Care to sent to Dr. Claudie [facility] at [facility] Phone: [phone], Fax: [phone]

## 2022-05-01 ENCOUNTER — Encounter: Payer: Self-pay | Admitting: Podiatry

## 2022-05-01 ENCOUNTER — Ambulatory Visit (INDEPENDENT_AMBULATORY_CARE_PROVIDER_SITE_OTHER): Payer: Medicare HMO | Admitting: Podiatry

## 2022-05-01 DIAGNOSIS — B351 Tinea unguium: Secondary | ICD-10-CM

## 2022-05-01 DIAGNOSIS — M7661 Achilles tendinitis, right leg: Secondary | ICD-10-CM | POA: Diagnosis not present

## 2022-05-02 ENCOUNTER — Ambulatory Visit
Admission: RE | Admit: 2022-05-02 | Discharge: 2022-05-02 | Disposition: A | Discharge status: Home / Self Care | Payer: Medicare HMO | Source: Ambulatory Visit | Attending: Otolaryngology | Admitting: Otolaryngology

## 2022-05-02 DIAGNOSIS — T8130XA Disruption of wound, unspecified, initial encounter: Secondary | ICD-10-CM | POA: Diagnosis not present

## 2022-05-02 DIAGNOSIS — Q059 Spina bifida, unspecified: Secondary | ICD-10-CM | POA: Diagnosis not present

## 2022-05-02 DIAGNOSIS — J342 Deviated nasal septum: Secondary | ICD-10-CM | POA: Diagnosis not present

## 2022-05-02 DIAGNOSIS — J329 Chronic sinusitis, unspecified: Secondary | ICD-10-CM

## 2022-05-02 NOTE — Progress Notes (Signed)
Subjective:   Patient ID: Denise Daniels, female   DOB: 51 y.o.   MRN: [id number]   HPI Patient states feeling a lot better in the back and bottom of the heel and states that she is still using her boot   ROS      Objective:  Physical Exam  Neurovascular status intact with pain in the posterior aspect of the right heel localized that is at the insertional point of the Achilles into the calcaneus.  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  Reappoint if needed may require other treatments depending on response

## 2022-05-05 ENCOUNTER — Ambulatory Visit
Admission: RE | Admit: 2022-05-05 | Discharge: 2022-05-05 | Disposition: A | Discharge status: Home / Self Care | Payer: Medicare HMO | Source: Ambulatory Visit | Attending: Neurology | Admitting: Neurology

## 2022-05-05 ENCOUNTER — Telehealth: Payer: Self-pay | Admitting: Neurology

## 2022-05-05 VITALS — BP 147/80 | HR 64

## 2022-05-05 DIAGNOSIS — H4711 Papilledema associated with increased intracranial pressure: Secondary | ICD-10-CM

## 2022-05-05 DIAGNOSIS — R519 Headache, unspecified: Secondary | ICD-10-CM

## 2022-05-05 DIAGNOSIS — H4710 Unspecified papilledema: Secondary | ICD-10-CM | POA: Diagnosis not present

## 2022-05-05 DIAGNOSIS — G932 Benign intracranial hypertension: Secondary | ICD-10-CM

## 2022-05-05 LAB — CSF CELL COUNT WITH DIFFERENTIAL
RBC Count, CSF: 0 cells/uL
TOTAL NUCLEATED CELL: 2 cells/uL (ref 0–5)

## 2022-05-05 LAB — GLUCOSE, CSF: Glucose, CSF: 69 mg/dL (ref 40–80)

## 2022-05-05 LAB — PROTEIN, CSF: Total Protein, CSF: 38 mg/dL (ref 15–45)

## 2022-05-05 NOTE — Telephone Encounter (Signed)
Pod 4 please call patient: Patient has elevated intracranial pressure, opening pressure was elevated, when she is feeling better after the lumbar puncture I like her to start the Diamox I have already prescribed for her.  And I will follow-up with her in the office.  See phone note.  Thanks

## 2022-05-05 NOTE — Discharge Instructions (Signed)

## 2022-05-06 ENCOUNTER — Telehealth: Payer: Self-pay | Admitting: *Deleted

## 2022-05-06 NOTE — Telephone Encounter (Signed)
Called to relayed results and I noted that CSF results in Epic for our review.

## 2022-05-06 NOTE — Telephone Encounter (Addendum)
I called the patient and discussed the message from Dr Jaynee Eagles. Pt aware her LP did confirm elevated intracranial pressure. Pt so far is feeling ok, denies spinal headache. She had the LP at 12 pm yesterday and was told to rest for 2 days. Provided she does well through bedrest and doesn't develop spinal headache she will plan to start the Diamox at the end of bedrest period. Pt's questions were answered. She will monitor for s/e and report if she has any. Pt will f/u with Dr Jaynee Eagles on 07/02/22 at 21 and will call sooner if needed. She also said she has a pending procedure with Dr Benjamine Mola for the sinus issue. Hasn't been scheduled yet. Pt verbalized appreciation for the call.

## 2022-05-12 ENCOUNTER — Telehealth: Payer: Self-pay | Admitting: Neurology

## 2022-05-12 NOTE — Telephone Encounter (Signed)
Pt husband Louis Matte) is calling. Stated Pt has been having a hard time since medication changed. Louis Matte is requesting a call back from the nurse.

## 2022-05-12 NOTE — Telephone Encounter (Signed)
I called pts husband.  Pt has done well since LP, after 48 hours, she started the diamox '500mg'$  po bid and has noted that pt wants to sleep all the time,  not wanting to talk , motor skills depressed, no attention.  Was better this am, then took tablet and like in stupor/ mouth hanging open.  I would speak to Dr. Jaynee Eagles then call them back.  Her next dose this evening (is holding) until called.

## 2022-05-12 NOTE — Telephone Encounter (Signed)
Consulted Dr. Jaynee Eagles," As we discussed, she can reduce her diamox to once daily preferably at night and see if she improves however if he is very concerned and she is impaired he should take her to the emergency room ".   I relayed to husband and pt who was on speaker phone. ( That per Dr. Jaynee Eagles to skip tonight dose , start with taking '500mg'$  po qhs azetazolamide. (Start tomorrow night).  I relayed that is pts worsens , husband concerned then to seek care at ED.  He and pt verbalized understanding.  They do really feel that medication is what is causing these side effects.  Pt was speaking appropriately when she was relaying information about see ENT referral from Dr. Benjamine Mola to Dr. Dr. Fabio Asa [phone].  Appreciated call back care that she has been given

## 2022-05-15 DIAGNOSIS — G932 Benign intracranial hypertension: Secondary | ICD-10-CM | POA: Diagnosis not present

## 2022-05-15 DIAGNOSIS — H4710 Unspecified papilledema: Secondary | ICD-10-CM | POA: Diagnosis not present

## 2022-05-15 DIAGNOSIS — G9600 Cerebrospinal fluid leak, unspecified: Secondary | ICD-10-CM | POA: Diagnosis not present

## 2022-05-15 DIAGNOSIS — R519 Headache, unspecified: Secondary | ICD-10-CM | POA: Diagnosis not present

## 2022-05-15 DIAGNOSIS — J3489 Other specified disorders of nose and nasal sinuses: Secondary | ICD-10-CM | POA: Diagnosis not present

## 2022-05-21 DIAGNOSIS — E1143 Type 2 diabetes mellitus with diabetic autonomic (poly)neuropathy: Secondary | ICD-10-CM | POA: Diagnosis not present

## 2022-05-21 DIAGNOSIS — E038 Other specified hypothyroidism: Secondary | ICD-10-CM | POA: Diagnosis not present

## 2022-05-21 DIAGNOSIS — J4540 Moderate persistent asthma, uncomplicated: Secondary | ICD-10-CM | POA: Diagnosis not present

## 2022-05-21 DIAGNOSIS — Z6838 Body mass index (BMI) 38.0-38.9, adult: Secondary | ICD-10-CM | POA: Diagnosis not present

## 2022-05-21 DIAGNOSIS — I10 Essential (primary) hypertension: Secondary | ICD-10-CM | POA: Diagnosis not present

## 2022-05-21 DIAGNOSIS — G4730 Sleep apnea, unspecified: Secondary | ICD-10-CM | POA: Diagnosis not present

## 2022-05-22 ENCOUNTER — Ambulatory Visit
Admission: RE | Admit: 2022-05-22 | Discharge: 2022-05-22 | Disposition: A | Discharge status: Home / Self Care | Payer: Medicare HMO | Source: Ambulatory Visit | Attending: Neurology | Admitting: Neurology

## 2022-05-22 DIAGNOSIS — G8929 Other chronic pain: Secondary | ICD-10-CM

## 2022-05-22 DIAGNOSIS — H4710 Unspecified papilledema: Secondary | ICD-10-CM | POA: Diagnosis not present

## 2022-05-22 DIAGNOSIS — Z8709 Personal history of other diseases of the respiratory system: Secondary | ICD-10-CM | POA: Diagnosis not present

## 2022-05-22 DIAGNOSIS — H4711 Papilledema associated with increased intracranial pressure: Secondary | ICD-10-CM

## 2022-05-22 DIAGNOSIS — R519 Headache, unspecified: Secondary | ICD-10-CM | POA: Diagnosis not present

## 2022-05-22 DIAGNOSIS — G932 Benign intracranial hypertension: Secondary | ICD-10-CM

## 2022-05-22 DIAGNOSIS — I10 Essential (primary) hypertension: Secondary | ICD-10-CM | POA: Diagnosis not present

## 2022-05-22 MED ORDER — IOPAMIDOL (ISOVUE-370) INJECTION 76%
75.0000 mL | Freq: Once | INTRAVENOUS | Status: AC | PRN
  Administered 2022-05-22: 75 mL via INTRAVENOUS

## 2022-05-26 ENCOUNTER — Telehealth: Payer: Self-pay | Admitting: Neurology

## 2022-05-26 DIAGNOSIS — J3489 Other specified disorders of nose and nasal sinuses: Secondary | ICD-10-CM

## 2022-05-26 DIAGNOSIS — G932 Benign intracranial hypertension: Secondary | ICD-10-CM

## 2022-05-26 DIAGNOSIS — H4711 Papilledema associated with increased intracranial pressure: Secondary | ICD-10-CM

## 2022-05-26 NOTE — Telephone Encounter (Signed)
This encounter was created in error - please disregard.

## 2022-05-26 NOTE — Telephone Encounter (Signed)
Per Dr. Jaynee Eagles: "No evidence of a clot but your veins look very small and this could be causing the build up of fluid I would like to send you for a referral to see if stenting those veins may help, I will ask my team to call you (pod 4 see phone note for more details) "  I called patient.  I discussed her venogram results and recommendations.  Patient thinks she has already been referred to Dr. Bridgette Habermann at [facility] neurosurgery.  However, she is agreeable to [place] referring to him as well.  She will let us know if she has not heard from that office within a few weeks.  Patient verbalized understanding of results.

## 2022-05-26 NOTE — Telephone Encounter (Signed)
I would like to send her to [facility] at [facility] to examine the veins I her brain, they look very small and that could be why she has a build up of fluid. They may consider stenting them. 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 Ask Tori to get the CTV, MRI brain on a disk and send with the referral if patient agrees, you can put the results below in the comments of the referral, thank you  IMPRESSION: 1. No evidence of venous sinus thrombosis. 2. Both distal transverse sinuses and transverse sigmoid junction regions are quite diminutive. Question the presence of small arachnoid granulations in addition. 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

## 2022-05-26 NOTE — Telephone Encounter (Signed)
Deneise Lever, make sure to send the paper MRI and CTV results with this referral. Hilda Blades, can you get the two CDs sent? Thanks

## 2022-05-26 NOTE — Telephone Encounter (Signed)
Faxed referral to [facility] Neurosurgery with MRI brain & CTV results. Hilda Blades to send CDs.

## 2022-05-26 NOTE — Addendum Note (Signed)
Addended by: Lester [place] A on: 05/26/2022 01:43 PM   Modules accepted: Orders

## 2022-05-29 ENCOUNTER — Ambulatory Visit: Payer: Medicare HMO | Admitting: Internal Medicine

## 2022-06-04 DIAGNOSIS — M9902 Segmental and somatic dysfunction of thoracic region: Secondary | ICD-10-CM | POA: Diagnosis not present

## 2022-06-04 DIAGNOSIS — M542 Cervicalgia: Secondary | ICD-10-CM | POA: Diagnosis not present

## 2022-06-04 DIAGNOSIS — M546 Pain in thoracic spine: Secondary | ICD-10-CM | POA: Diagnosis not present

## 2022-06-04 DIAGNOSIS — M9901 Segmental and somatic dysfunction of cervical region: Secondary | ICD-10-CM | POA: Diagnosis not present

## 2022-06-04 DIAGNOSIS — M62830 Muscle spasm of back: Secondary | ICD-10-CM | POA: Diagnosis not present

## 2022-06-04 DIAGNOSIS — M9903 Segmental and somatic dysfunction of lumbar region: Secondary | ICD-10-CM | POA: Diagnosis not present

## 2022-06-06 ENCOUNTER — Other Ambulatory Visit: Payer: Self-pay | Admitting: Internal Medicine

## 2022-06-06 DIAGNOSIS — Z1231 Encounter for screening mammogram for malignant neoplasm of breast: Secondary | ICD-10-CM

## 2022-06-23 DIAGNOSIS — M9901 Segmental and somatic dysfunction of cervical region: Secondary | ICD-10-CM | POA: Diagnosis not present

## 2022-06-23 DIAGNOSIS — M9903 Segmental and somatic dysfunction of lumbar region: Secondary | ICD-10-CM | POA: Diagnosis not present

## 2022-06-23 DIAGNOSIS — M542 Cervicalgia: Secondary | ICD-10-CM | POA: Diagnosis not present

## 2022-06-23 DIAGNOSIS — M546 Pain in thoracic spine: Secondary | ICD-10-CM | POA: Diagnosis not present

## 2022-06-23 DIAGNOSIS — M62830 Muscle spasm of back: Secondary | ICD-10-CM | POA: Diagnosis not present

## 2022-06-23 DIAGNOSIS — M9902 Segmental and somatic dysfunction of thoracic region: Secondary | ICD-10-CM | POA: Diagnosis not present

## 2022-06-27 ENCOUNTER — Ambulatory Visit
Admission: RE | Admit: 2022-06-27 | Discharge: 2022-06-27 | Disposition: A | Discharge status: Home / Self Care | Payer: Medicare HMO | Source: Ambulatory Visit | Attending: Internal Medicine | Admitting: Internal Medicine

## 2022-06-27 DIAGNOSIS — Z1231 Encounter for screening mammogram for malignant neoplasm of breast: Secondary | ICD-10-CM | POA: Diagnosis not present

## 2022-07-02 ENCOUNTER — Ambulatory Visit: Payer: Medicare HMO | Admitting: Neurology

## 2022-07-18 DIAGNOSIS — G9600 Cerebrospinal fluid leak, unspecified: Secondary | ICD-10-CM | POA: Diagnosis not present

## 2022-07-18 DIAGNOSIS — Z888 Allergy status to other drugs, medicaments and biological substances status: Secondary | ICD-10-CM | POA: Diagnosis not present

## 2022-07-18 DIAGNOSIS — G9601 Cranial cerebrospinal fluid leak, spontaneous: Secondary | ICD-10-CM | POA: Diagnosis not present

## 2022-07-18 DIAGNOSIS — G932 Benign intracranial hypertension: Secondary | ICD-10-CM | POA: Diagnosis not present

## 2022-07-25 DIAGNOSIS — M542 Cervicalgia: Secondary | ICD-10-CM | POA: Diagnosis not present

## 2022-07-25 DIAGNOSIS — M9903 Segmental and somatic dysfunction of lumbar region: Secondary | ICD-10-CM | POA: Diagnosis not present

## 2022-07-25 DIAGNOSIS — M546 Pain in thoracic spine: Secondary | ICD-10-CM | POA: Diagnosis not present

## 2022-07-25 DIAGNOSIS — M9902 Segmental and somatic dysfunction of thoracic region: Secondary | ICD-10-CM | POA: Diagnosis not present

## 2022-07-25 DIAGNOSIS — M9901 Segmental and somatic dysfunction of cervical region: Secondary | ICD-10-CM | POA: Diagnosis not present

## 2022-07-25 DIAGNOSIS — M62830 Muscle spasm of back: Secondary | ICD-10-CM | POA: Diagnosis not present

## 2022-08-13 DIAGNOSIS — Z79899 Other long term (current) drug therapy: Secondary | ICD-10-CM | POA: Diagnosis not present

## 2022-08-13 DIAGNOSIS — Q059 Spina bifida, unspecified: Secondary | ICD-10-CM | POA: Diagnosis not present

## 2022-08-13 DIAGNOSIS — I676 Nonpyogenic thrombosis of intracranial venous system: Secondary | ICD-10-CM | POA: Diagnosis not present

## 2022-08-13 DIAGNOSIS — G932 Benign intracranial hypertension: Secondary | ICD-10-CM | POA: Diagnosis not present

## 2022-08-13 DIAGNOSIS — G9601 Cranial cerebrospinal fluid leak, spontaneous: Secondary | ICD-10-CM | POA: Diagnosis not present

## 2022-08-21 DIAGNOSIS — G9600 Cerebrospinal fluid leak, unspecified: Secondary | ICD-10-CM | POA: Diagnosis not present

## 2022-08-21 DIAGNOSIS — H4710 Unspecified papilledema: Secondary | ICD-10-CM | POA: Diagnosis not present

## 2022-08-21 DIAGNOSIS — D447 Neoplasm of uncertain behavior of aortic body and other paraganglia: Secondary | ICD-10-CM | POA: Diagnosis not present

## 2022-08-21 DIAGNOSIS — G932 Benign intracranial hypertension: Secondary | ICD-10-CM | POA: Diagnosis not present

## 2022-08-21 DIAGNOSIS — J3489 Other specified disorders of nose and nasal sinuses: Secondary | ICD-10-CM | POA: Diagnosis not present

## 2022-08-22 ENCOUNTER — Encounter: Payer: Self-pay | Admitting: Neurology

## 2022-08-25 DIAGNOSIS — M542 Cervicalgia: Secondary | ICD-10-CM | POA: Diagnosis not present

## 2022-08-25 DIAGNOSIS — M546 Pain in thoracic spine: Secondary | ICD-10-CM | POA: Diagnosis not present

## 2022-08-25 DIAGNOSIS — M9902 Segmental and somatic dysfunction of thoracic region: Secondary | ICD-10-CM | POA: Diagnosis not present

## 2022-08-25 DIAGNOSIS — M9901 Segmental and somatic dysfunction of cervical region: Secondary | ICD-10-CM | POA: Diagnosis not present

## 2022-08-25 DIAGNOSIS — M62830 Muscle spasm of back: Secondary | ICD-10-CM | POA: Diagnosis not present

## 2022-08-25 DIAGNOSIS — M9903 Segmental and somatic dysfunction of lumbar region: Secondary | ICD-10-CM | POA: Diagnosis not present

## 2022-08-26 DIAGNOSIS — Z79899 Other long term (current) drug therapy: Secondary | ICD-10-CM | POA: Diagnosis not present

## 2022-08-26 DIAGNOSIS — I10 Essential (primary) hypertension: Secondary | ICD-10-CM | POA: Diagnosis not present

## 2022-08-26 DIAGNOSIS — Z6838 Body mass index (BMI) 38.0-38.9, adult: Secondary | ICD-10-CM | POA: Diagnosis not present

## 2022-08-26 DIAGNOSIS — E1143 Type 2 diabetes mellitus with diabetic autonomic (poly)neuropathy: Secondary | ICD-10-CM | POA: Diagnosis not present

## 2022-08-26 DIAGNOSIS — E038 Other specified hypothyroidism: Secondary | ICD-10-CM | POA: Diagnosis not present

## 2022-08-26 DIAGNOSIS — G4730 Sleep apnea, unspecified: Secondary | ICD-10-CM | POA: Diagnosis not present

## 2022-08-26 DIAGNOSIS — R5382 Chronic fatigue, unspecified: Secondary | ICD-10-CM | POA: Diagnosis not present

## 2022-08-26 DIAGNOSIS — J4540 Moderate persistent asthma, uncomplicated: Secondary | ICD-10-CM | POA: Diagnosis not present

## 2022-08-29 DIAGNOSIS — D447 Neoplasm of uncertain behavior of aortic body and other paraganglia: Secondary | ICD-10-CM | POA: Diagnosis not present

## 2022-09-10 DIAGNOSIS — M542 Cervicalgia: Secondary | ICD-10-CM | POA: Diagnosis not present

## 2022-09-10 DIAGNOSIS — M9901 Segmental and somatic dysfunction of cervical region: Secondary | ICD-10-CM | POA: Diagnosis not present

## 2022-09-10 DIAGNOSIS — M546 Pain in thoracic spine: Secondary | ICD-10-CM | POA: Diagnosis not present

## 2022-09-10 DIAGNOSIS — M9902 Segmental and somatic dysfunction of thoracic region: Secondary | ICD-10-CM | POA: Diagnosis not present

## 2022-09-10 DIAGNOSIS — M62830 Muscle spasm of back: Secondary | ICD-10-CM | POA: Diagnosis not present

## 2022-09-10 DIAGNOSIS — M9903 Segmental and somatic dysfunction of lumbar region: Secondary | ICD-10-CM | POA: Diagnosis not present

## 2022-09-17 DIAGNOSIS — Z7901 Long term (current) use of anticoagulants: Secondary | ICD-10-CM | POA: Diagnosis not present

## 2022-09-17 DIAGNOSIS — G932 Benign intracranial hypertension: Secondary | ICD-10-CM | POA: Diagnosis not present

## 2022-09-17 DIAGNOSIS — E119 Type 2 diabetes mellitus without complications: Secondary | ICD-10-CM | POA: Diagnosis not present

## 2022-09-17 DIAGNOSIS — I871 Compression of vein: Secondary | ICD-10-CM | POA: Diagnosis not present

## 2022-09-17 DIAGNOSIS — Z7985 Long-term (current) use of injectable non-insulin antidiabetic drugs: Secondary | ICD-10-CM | POA: Diagnosis not present

## 2022-09-17 DIAGNOSIS — Z7982 Long term (current) use of aspirin: Secondary | ICD-10-CM | POA: Diagnosis not present

## 2022-09-18 DIAGNOSIS — Z7901 Long term (current) use of anticoagulants: Secondary | ICD-10-CM | POA: Diagnosis not present

## 2022-09-18 DIAGNOSIS — Z7982 Long term (current) use of aspirin: Secondary | ICD-10-CM | POA: Diagnosis not present

## 2022-09-18 DIAGNOSIS — G932 Benign intracranial hypertension: Secondary | ICD-10-CM | POA: Diagnosis not present

## 2022-09-18 DIAGNOSIS — E119 Type 2 diabetes mellitus without complications: Secondary | ICD-10-CM | POA: Diagnosis not present

## 2022-09-18 DIAGNOSIS — Z7985 Long-term (current) use of injectable non-insulin antidiabetic drugs: Secondary | ICD-10-CM | POA: Diagnosis not present

## 2022-09-26 DIAGNOSIS — Z8719 Personal history of other diseases of the digestive system: Secondary | ICD-10-CM | POA: Diagnosis not present

## 2022-09-26 DIAGNOSIS — Z9889 Other specified postprocedural states: Secondary | ICD-10-CM | POA: Diagnosis not present

## 2022-09-26 DIAGNOSIS — R1084 Generalized abdominal pain: Secondary | ICD-10-CM | POA: Diagnosis not present

## 2022-09-26 DIAGNOSIS — Z9049 Acquired absence of other specified parts of digestive tract: Secondary | ICD-10-CM | POA: Diagnosis not present

## 2022-09-26 DIAGNOSIS — K5792 Diverticulitis of intestine, part unspecified, without perforation or abscess without bleeding: Secondary | ICD-10-CM | POA: Diagnosis not present

## 2022-09-26 DIAGNOSIS — R1114 Bilious vomiting: Secondary | ICD-10-CM | POA: Diagnosis not present

## 2022-09-26 DIAGNOSIS — I959 Hypotension, unspecified: Secondary | ICD-10-CM | POA: Diagnosis not present

## 2022-09-26 DIAGNOSIS — K5730 Diverticulosis of large intestine without perforation or abscess without bleeding: Secondary | ICD-10-CM | POA: Diagnosis not present

## 2022-09-26 DIAGNOSIS — R531 Weakness: Secondary | ICD-10-CM | POA: Diagnosis not present

## 2022-09-26 DIAGNOSIS — Z7984 Long term (current) use of oral hypoglycemic drugs: Secondary | ICD-10-CM | POA: Diagnosis not present

## 2022-09-26 DIAGNOSIS — R58 Hemorrhage, not elsewhere classified: Secondary | ICD-10-CM | POA: Diagnosis not present

## 2022-09-26 DIAGNOSIS — I700 Atherosclerosis of aorta: Secondary | ICD-10-CM | POA: Diagnosis not present

## 2022-09-26 DIAGNOSIS — M797 Fibromyalgia: Secondary | ICD-10-CM | POA: Diagnosis not present

## 2022-09-26 DIAGNOSIS — N3289 Other specified disorders of bladder: Secondary | ICD-10-CM | POA: Diagnosis not present

## 2022-09-26 DIAGNOSIS — M1990 Unspecified osteoarthritis, unspecified site: Secondary | ICD-10-CM | POA: Diagnosis not present

## 2022-09-26 DIAGNOSIS — K922 Gastrointestinal hemorrhage, unspecified: Secondary | ICD-10-CM | POA: Diagnosis not present

## 2022-09-26 DIAGNOSIS — R42 Dizziness and giddiness: Secondary | ICD-10-CM | POA: Diagnosis not present

## 2022-09-26 DIAGNOSIS — E119 Type 2 diabetes mellitus without complications: Secondary | ICD-10-CM | POA: Diagnosis not present

## 2022-09-27 DIAGNOSIS — Z9689 Presence of other specified functional implants: Secondary | ICD-10-CM | POA: Diagnosis not present

## 2022-09-27 DIAGNOSIS — M797 Fibromyalgia: Secondary | ICD-10-CM | POA: Diagnosis not present

## 2022-09-27 DIAGNOSIS — K5792 Diverticulitis of intestine, part unspecified, without perforation or abscess without bleeding: Secondary | ICD-10-CM | POA: Diagnosis not present

## 2022-09-27 DIAGNOSIS — K5733 Diverticulitis of large intestine without perforation or abscess with bleeding: Secondary | ICD-10-CM | POA: Diagnosis not present

## 2022-09-27 DIAGNOSIS — Z8719 Personal history of other diseases of the digestive system: Secondary | ICD-10-CM | POA: Diagnosis not present

## 2022-09-27 DIAGNOSIS — K5731 Diverticulosis of large intestine without perforation or abscess with bleeding: Secondary | ICD-10-CM | POA: Diagnosis not present

## 2022-09-27 DIAGNOSIS — D62 Acute posthemorrhagic anemia: Secondary | ICD-10-CM | POA: Diagnosis not present

## 2022-09-27 DIAGNOSIS — E039 Hypothyroidism, unspecified: Secondary | ICD-10-CM | POA: Diagnosis not present

## 2022-09-27 DIAGNOSIS — R109 Unspecified abdominal pain: Secondary | ICD-10-CM | POA: Diagnosis not present

## 2022-09-27 DIAGNOSIS — E1142 Type 2 diabetes mellitus with diabetic polyneuropathy: Secondary | ICD-10-CM | POA: Diagnosis not present

## 2022-09-27 DIAGNOSIS — R571 Hypovolemic shock: Secondary | ICD-10-CM | POA: Diagnosis not present

## 2022-09-27 DIAGNOSIS — Z7982 Long term (current) use of aspirin: Secondary | ICD-10-CM | POA: Diagnosis not present

## 2022-09-27 DIAGNOSIS — K922 Gastrointestinal hemorrhage, unspecified: Secondary | ICD-10-CM | POA: Diagnosis not present

## 2022-09-27 DIAGNOSIS — K5900 Constipation, unspecified: Secondary | ICD-10-CM | POA: Diagnosis not present

## 2022-09-27 DIAGNOSIS — Z7902 Long term (current) use of antithrombotics/antiplatelets: Secondary | ICD-10-CM | POA: Diagnosis not present

## 2022-09-27 DIAGNOSIS — R578 Other shock: Secondary | ICD-10-CM | POA: Diagnosis not present

## 2022-09-27 DIAGNOSIS — I10 Essential (primary) hypertension: Secondary | ICD-10-CM | POA: Diagnosis not present

## 2022-09-27 DIAGNOSIS — G932 Benign intracranial hypertension: Secondary | ICD-10-CM | POA: Diagnosis not present

## 2022-10-02 DIAGNOSIS — Z95828 Presence of other vascular implants and grafts: Secondary | ICD-10-CM | POA: Diagnosis not present

## 2022-10-02 DIAGNOSIS — J3489 Other specified disorders of nose and nasal sinuses: Secondary | ICD-10-CM | POA: Diagnosis not present

## 2022-10-02 DIAGNOSIS — G932 Benign intracranial hypertension: Secondary | ICD-10-CM | POA: Diagnosis not present

## 2022-10-08 DIAGNOSIS — E559 Vitamin D deficiency, unspecified: Secondary | ICD-10-CM | POA: Diagnosis not present

## 2022-10-08 DIAGNOSIS — I10 Essential (primary) hypertension: Secondary | ICD-10-CM | POA: Diagnosis not present

## 2022-10-08 DIAGNOSIS — E1143 Type 2 diabetes mellitus with diabetic autonomic (poly)neuropathy: Secondary | ICD-10-CM | POA: Diagnosis not present

## 2022-10-08 DIAGNOSIS — R5382 Chronic fatigue, unspecified: Secondary | ICD-10-CM | POA: Diagnosis not present

## 2022-10-08 DIAGNOSIS — Z6838 Body mass index (BMI) 38.0-38.9, adult: Secondary | ICD-10-CM | POA: Diagnosis not present

## 2022-10-08 DIAGNOSIS — K921 Melena: Secondary | ICD-10-CM | POA: Diagnosis not present

## 2022-10-08 DIAGNOSIS — G4730 Sleep apnea, unspecified: Secondary | ICD-10-CM | POA: Diagnosis not present

## 2022-10-08 DIAGNOSIS — J4540 Moderate persistent asthma, uncomplicated: Secondary | ICD-10-CM | POA: Diagnosis not present

## 2022-10-08 DIAGNOSIS — E038 Other specified hypothyroidism: Secondary | ICD-10-CM | POA: Diagnosis not present

## 2022-10-15 DIAGNOSIS — M542 Cervicalgia: Secondary | ICD-10-CM | POA: Diagnosis not present

## 2022-10-15 DIAGNOSIS — M9903 Segmental and somatic dysfunction of lumbar region: Secondary | ICD-10-CM | POA: Diagnosis not present

## 2022-10-15 DIAGNOSIS — M9902 Segmental and somatic dysfunction of thoracic region: Secondary | ICD-10-CM | POA: Diagnosis not present

## 2022-10-15 DIAGNOSIS — M9901 Segmental and somatic dysfunction of cervical region: Secondary | ICD-10-CM | POA: Diagnosis not present

## 2022-10-15 DIAGNOSIS — M62830 Muscle spasm of back: Secondary | ICD-10-CM | POA: Diagnosis not present

## 2022-10-15 DIAGNOSIS — M546 Pain in thoracic spine: Secondary | ICD-10-CM | POA: Diagnosis not present

## 2022-10-27 DIAGNOSIS — M25512 Pain in left shoulder: Secondary | ICD-10-CM | POA: Diagnosis not present

## 2022-10-27 DIAGNOSIS — G8929 Other chronic pain: Secondary | ICD-10-CM | POA: Diagnosis not present

## 2022-10-27 DIAGNOSIS — M7502 Adhesive capsulitis of left shoulder: Secondary | ICD-10-CM | POA: Diagnosis not present

## 2022-10-27 DIAGNOSIS — M542 Cervicalgia: Secondary | ICD-10-CM | POA: Diagnosis not present

## 2022-11-05 DIAGNOSIS — M9901 Segmental and somatic dysfunction of cervical region: Secondary | ICD-10-CM | POA: Diagnosis not present

## 2022-11-05 DIAGNOSIS — M9902 Segmental and somatic dysfunction of thoracic region: Secondary | ICD-10-CM | POA: Diagnosis not present

## 2022-11-05 DIAGNOSIS — M546 Pain in thoracic spine: Secondary | ICD-10-CM | POA: Diagnosis not present

## 2022-11-05 DIAGNOSIS — M542 Cervicalgia: Secondary | ICD-10-CM | POA: Diagnosis not present

## 2022-11-05 DIAGNOSIS — M62830 Muscle spasm of back: Secondary | ICD-10-CM | POA: Diagnosis not present

## 2022-11-05 DIAGNOSIS — M9903 Segmental and somatic dysfunction of lumbar region: Secondary | ICD-10-CM | POA: Diagnosis not present

## 2022-11-07 DIAGNOSIS — M25512 Pain in left shoulder: Secondary | ICD-10-CM | POA: Diagnosis not present

## 2022-11-07 DIAGNOSIS — M7502 Adhesive capsulitis of left shoulder: Secondary | ICD-10-CM | POA: Diagnosis not present

## 2022-11-10 DIAGNOSIS — I10 Essential (primary) hypertension: Secondary | ICD-10-CM | POA: Diagnosis not present

## 2022-11-10 DIAGNOSIS — E782 Mixed hyperlipidemia: Secondary | ICD-10-CM | POA: Diagnosis not present

## 2022-11-14 DIAGNOSIS — G932 Benign intracranial hypertension: Secondary | ICD-10-CM | POA: Diagnosis not present

## 2022-11-20 DIAGNOSIS — M25512 Pain in left shoulder: Secondary | ICD-10-CM | POA: Diagnosis not present

## 2022-11-20 DIAGNOSIS — M170 Bilateral primary osteoarthritis of knee: Secondary | ICD-10-CM | POA: Diagnosis not present

## 2022-11-20 DIAGNOSIS — M7502 Adhesive capsulitis of left shoulder: Secondary | ICD-10-CM | POA: Diagnosis not present

## 2022-11-24 DIAGNOSIS — E039 Hypothyroidism, unspecified: Secondary | ICD-10-CM | POA: Diagnosis not present

## 2022-11-24 DIAGNOSIS — K5792 Diverticulitis of intestine, part unspecified, without perforation or abscess without bleeding: Secondary | ICD-10-CM | POA: Diagnosis not present

## 2022-11-24 DIAGNOSIS — K920 Hematemesis: Secondary | ICD-10-CM | POA: Diagnosis not present

## 2022-11-25 DIAGNOSIS — E038 Other specified hypothyroidism: Secondary | ICD-10-CM | POA: Diagnosis not present

## 2022-11-25 DIAGNOSIS — Z0000 Encounter for general adult medical examination without abnormal findings: Secondary | ICD-10-CM | POA: Diagnosis not present

## 2022-11-25 DIAGNOSIS — K921 Melena: Secondary | ICD-10-CM | POA: Diagnosis not present

## 2022-11-25 DIAGNOSIS — E559 Vitamin D deficiency, unspecified: Secondary | ICD-10-CM | POA: Diagnosis not present

## 2022-11-25 DIAGNOSIS — G4730 Sleep apnea, unspecified: Secondary | ICD-10-CM | POA: Diagnosis not present

## 2022-11-25 DIAGNOSIS — R5382 Chronic fatigue, unspecified: Secondary | ICD-10-CM | POA: Diagnosis not present

## 2022-11-25 DIAGNOSIS — E1143 Type 2 diabetes mellitus with diabetic autonomic (poly)neuropathy: Secondary | ICD-10-CM | POA: Diagnosis not present

## 2022-11-25 DIAGNOSIS — Z6837 Body mass index (BMI) 37.0-37.9, adult: Secondary | ICD-10-CM | POA: Diagnosis not present

## 2022-11-25 DIAGNOSIS — T466X5A Adverse effect of antihyperlipidemic and antiarteriosclerotic drugs, initial encounter: Secondary | ICD-10-CM | POA: Diagnosis not present

## 2022-11-25 DIAGNOSIS — E669 Obesity, unspecified: Secondary | ICD-10-CM | POA: Diagnosis not present

## 2022-11-25 DIAGNOSIS — I10 Essential (primary) hypertension: Secondary | ICD-10-CM | POA: Diagnosis not present

## 2022-11-25 DIAGNOSIS — J4540 Moderate persistent asthma, uncomplicated: Secondary | ICD-10-CM | POA: Diagnosis not present

## 2022-12-01 DIAGNOSIS — I10 Essential (primary) hypertension: Secondary | ICD-10-CM | POA: Diagnosis not present

## 2022-12-01 DIAGNOSIS — R000 Tachycardia, unspecified: Secondary | ICD-10-CM | POA: Diagnosis not present

## 2022-12-01 DIAGNOSIS — R931 Abnormal findings on diagnostic imaging of heart and coronary circulation: Secondary | ICD-10-CM | POA: Diagnosis not present

## 2022-12-01 DIAGNOSIS — Z01810 Encounter for preprocedural cardiovascular examination: Secondary | ICD-10-CM | POA: Diagnosis not present

## 2022-12-05 DIAGNOSIS — Z7985 Long-term (current) use of injectable non-insulin antidiabetic drugs: Secondary | ICD-10-CM | POA: Diagnosis not present

## 2022-12-05 DIAGNOSIS — Z8719 Personal history of other diseases of the digestive system: Secondary | ICD-10-CM | POA: Diagnosis not present

## 2022-12-05 DIAGNOSIS — E079 Disorder of thyroid, unspecified: Secondary | ICD-10-CM | POA: Diagnosis not present

## 2022-12-05 DIAGNOSIS — K920 Hematemesis: Secondary | ICD-10-CM | POA: Diagnosis not present

## 2022-12-05 DIAGNOSIS — E119 Type 2 diabetes mellitus without complications: Secondary | ICD-10-CM | POA: Diagnosis not present

## 2022-12-05 DIAGNOSIS — K5792 Diverticulitis of intestine, part unspecified, without perforation or abscess without bleeding: Secondary | ICD-10-CM | POA: Diagnosis not present

## 2022-12-05 DIAGNOSIS — E039 Hypothyroidism, unspecified: Secondary | ICD-10-CM | POA: Diagnosis not present

## 2022-12-05 DIAGNOSIS — K5730 Diverticulosis of large intestine without perforation or abscess without bleeding: Secondary | ICD-10-CM | POA: Diagnosis not present

## 2022-12-05 DIAGNOSIS — I10 Essential (primary) hypertension: Secondary | ICD-10-CM | POA: Diagnosis not present

## 2022-12-05 DIAGNOSIS — R1110 Vomiting, unspecified: Secondary | ICD-10-CM | POA: Diagnosis not present

## 2022-12-12 DIAGNOSIS — R5382 Chronic fatigue, unspecified: Secondary | ICD-10-CM | POA: Diagnosis not present

## 2022-12-12 DIAGNOSIS — N182 Chronic kidney disease, stage 2 (mild): Secondary | ICD-10-CM | POA: Diagnosis not present

## 2022-12-12 DIAGNOSIS — E1143 Type 2 diabetes mellitus with diabetic autonomic (poly)neuropathy: Secondary | ICD-10-CM | POA: Diagnosis not present

## 2022-12-17 DIAGNOSIS — K5790 Diverticulosis of intestine, part unspecified, without perforation or abscess without bleeding: Secondary | ICD-10-CM | POA: Diagnosis not present

## 2022-12-17 DIAGNOSIS — K5900 Constipation, unspecified: Secondary | ICD-10-CM | POA: Diagnosis not present

## 2022-12-17 DIAGNOSIS — K5904 Chronic idiopathic constipation: Secondary | ICD-10-CM | POA: Diagnosis not present

## 2022-12-17 DIAGNOSIS — K649 Unspecified hemorrhoids: Secondary | ICD-10-CM | POA: Diagnosis not present

## 2022-12-29 DIAGNOSIS — M9903 Segmental and somatic dysfunction of lumbar region: Secondary | ICD-10-CM | POA: Diagnosis not present

## 2022-12-29 DIAGNOSIS — M542 Cervicalgia: Secondary | ICD-10-CM | POA: Diagnosis not present

## 2022-12-29 DIAGNOSIS — M62830 Muscle spasm of back: Secondary | ICD-10-CM | POA: Diagnosis not present

## 2022-12-29 DIAGNOSIS — M9901 Segmental and somatic dysfunction of cervical region: Secondary | ICD-10-CM | POA: Diagnosis not present

## 2022-12-29 DIAGNOSIS — M9902 Segmental and somatic dysfunction of thoracic region: Secondary | ICD-10-CM | POA: Diagnosis not present

## 2022-12-29 DIAGNOSIS — M546 Pain in thoracic spine: Secondary | ICD-10-CM | POA: Diagnosis not present

## 2022-12-31 DIAGNOSIS — G932 Benign intracranial hypertension: Secondary | ICD-10-CM | POA: Diagnosis not present

## 2022-12-31 DIAGNOSIS — D447 Neoplasm of uncertain behavior of aortic body and other paraganglia: Secondary | ICD-10-CM | POA: Diagnosis not present

## 2022-12-31 DIAGNOSIS — G9600 Cerebrospinal fluid leak, unspecified: Secondary | ICD-10-CM | POA: Diagnosis not present

## 2023-01-12 DIAGNOSIS — H4711 Papilledema associated with increased intracranial pressure: Secondary | ICD-10-CM | POA: Diagnosis not present

## 2023-01-16 DIAGNOSIS — M9903 Segmental and somatic dysfunction of lumbar region: Secondary | ICD-10-CM | POA: Diagnosis not present

## 2023-01-16 DIAGNOSIS — M62830 Muscle spasm of back: Secondary | ICD-10-CM | POA: Diagnosis not present

## 2023-01-16 DIAGNOSIS — M9901 Segmental and somatic dysfunction of cervical region: Secondary | ICD-10-CM | POA: Diagnosis not present

## 2023-01-16 DIAGNOSIS — M9902 Segmental and somatic dysfunction of thoracic region: Secondary | ICD-10-CM | POA: Diagnosis not present

## 2023-01-16 DIAGNOSIS — M542 Cervicalgia: Secondary | ICD-10-CM | POA: Diagnosis not present

## 2023-01-16 DIAGNOSIS — M546 Pain in thoracic spine: Secondary | ICD-10-CM | POA: Diagnosis not present

## 2023-02-16 DIAGNOSIS — M546 Pain in thoracic spine: Secondary | ICD-10-CM | POA: Diagnosis not present

## 2023-02-16 DIAGNOSIS — M62830 Muscle spasm of back: Secondary | ICD-10-CM | POA: Diagnosis not present

## 2023-02-16 DIAGNOSIS — M9901 Segmental and somatic dysfunction of cervical region: Secondary | ICD-10-CM | POA: Diagnosis not present

## 2023-02-16 DIAGNOSIS — M9902 Segmental and somatic dysfunction of thoracic region: Secondary | ICD-10-CM | POA: Diagnosis not present

## 2023-02-16 DIAGNOSIS — M9903 Segmental and somatic dysfunction of lumbar region: Secondary | ICD-10-CM | POA: Diagnosis not present

## 2023-02-16 DIAGNOSIS — M542 Cervicalgia: Secondary | ICD-10-CM | POA: Diagnosis not present

## 2023-02-24 DIAGNOSIS — E1143 Type 2 diabetes mellitus with diabetic autonomic (poly)neuropathy: Secondary | ICD-10-CM | POA: Diagnosis not present

## 2023-02-24 DIAGNOSIS — R5382 Chronic fatigue, unspecified: Secondary | ICD-10-CM | POA: Diagnosis not present

## 2023-02-24 DIAGNOSIS — E1121 Type 2 diabetes mellitus with diabetic nephropathy: Secondary | ICD-10-CM | POA: Diagnosis not present

## 2023-02-24 DIAGNOSIS — I10 Essential (primary) hypertension: Secondary | ICD-10-CM | POA: Diagnosis not present

## 2023-02-24 DIAGNOSIS — T466X5A Adverse effect of antihyperlipidemic and antiarteriosclerotic drugs, initial encounter: Secondary | ICD-10-CM | POA: Diagnosis not present

## 2023-02-24 DIAGNOSIS — N182 Chronic kidney disease, stage 2 (mild): Secondary | ICD-10-CM | POA: Diagnosis not present

## 2023-02-24 DIAGNOSIS — J4540 Moderate persistent asthma, uncomplicated: Secondary | ICD-10-CM | POA: Diagnosis not present

## 2023-02-24 DIAGNOSIS — E038 Other specified hypothyroidism: Secondary | ICD-10-CM | POA: Diagnosis not present

## 2023-02-24 DIAGNOSIS — Z0000 Encounter for general adult medical examination without abnormal findings: Secondary | ICD-10-CM | POA: Diagnosis not present

## 2023-02-24 DIAGNOSIS — E669 Obesity, unspecified: Secondary | ICD-10-CM | POA: Diagnosis not present

## 2023-02-24 DIAGNOSIS — G4730 Sleep apnea, unspecified: Secondary | ICD-10-CM | POA: Diagnosis not present

## 2023-03-02 DIAGNOSIS — M546 Pain in thoracic spine: Secondary | ICD-10-CM | POA: Diagnosis not present

## 2023-03-02 DIAGNOSIS — M542 Cervicalgia: Secondary | ICD-10-CM | POA: Diagnosis not present

## 2023-03-02 DIAGNOSIS — M9903 Segmental and somatic dysfunction of lumbar region: Secondary | ICD-10-CM | POA: Diagnosis not present

## 2023-03-02 DIAGNOSIS — M9901 Segmental and somatic dysfunction of cervical region: Secondary | ICD-10-CM | POA: Diagnosis not present

## 2023-03-02 DIAGNOSIS — M62830 Muscle spasm of back: Secondary | ICD-10-CM | POA: Diagnosis not present

## 2023-03-02 DIAGNOSIS — M9902 Segmental and somatic dysfunction of thoracic region: Secondary | ICD-10-CM | POA: Diagnosis not present

## 2023-03-11 DIAGNOSIS — M25512 Pain in left shoulder: Secondary | ICD-10-CM | POA: Diagnosis not present

## 2023-03-11 DIAGNOSIS — M24812 Other specific joint derangements of left shoulder, not elsewhere classified: Secondary | ICD-10-CM | POA: Diagnosis not present

## 2023-03-11 DIAGNOSIS — G8929 Other chronic pain: Secondary | ICD-10-CM | POA: Diagnosis not present

## 2023-03-11 DIAGNOSIS — M7502 Adhesive capsulitis of left shoulder: Secondary | ICD-10-CM | POA: Diagnosis not present

## 2023-03-24 DIAGNOSIS — Z0100 Encounter for examination of eyes and vision without abnormal findings: Secondary | ICD-10-CM | POA: Diagnosis not present

## 2023-03-24 DIAGNOSIS — H47333 Pseudopapilledema of optic disc, bilateral: Secondary | ICD-10-CM | POA: Diagnosis not present

## 2023-03-24 DIAGNOSIS — H524 Presbyopia: Secondary | ICD-10-CM | POA: Diagnosis not present

## 2023-03-25 DIAGNOSIS — M25412 Effusion, left shoulder: Secondary | ICD-10-CM | POA: Diagnosis not present

## 2023-03-25 DIAGNOSIS — M7582 Other shoulder lesions, left shoulder: Secondary | ICD-10-CM | POA: Diagnosis not present

## 2023-03-25 DIAGNOSIS — M129 Arthropathy, unspecified: Secondary | ICD-10-CM | POA: Diagnosis not present

## 2023-03-25 DIAGNOSIS — M24812 Other specific joint derangements of left shoulder, not elsewhere classified: Secondary | ICD-10-CM | POA: Diagnosis not present

## 2023-03-30 DIAGNOSIS — M9903 Segmental and somatic dysfunction of lumbar region: Secondary | ICD-10-CM | POA: Diagnosis not present

## 2023-03-30 DIAGNOSIS — M9901 Segmental and somatic dysfunction of cervical region: Secondary | ICD-10-CM | POA: Diagnosis not present

## 2023-03-30 DIAGNOSIS — M24812 Other specific joint derangements of left shoulder, not elsewhere classified: Secondary | ICD-10-CM | POA: Diagnosis not present

## 2023-03-30 DIAGNOSIS — M62830 Muscle spasm of back: Secondary | ICD-10-CM | POA: Diagnosis not present

## 2023-03-30 DIAGNOSIS — M542 Cervicalgia: Secondary | ICD-10-CM | POA: Diagnosis not present

## 2023-03-30 DIAGNOSIS — M9902 Segmental and somatic dysfunction of thoracic region: Secondary | ICD-10-CM | POA: Diagnosis not present

## 2023-03-30 DIAGNOSIS — M546 Pain in thoracic spine: Secondary | ICD-10-CM | POA: Diagnosis not present

## 2023-04-27 DIAGNOSIS — M9902 Segmental and somatic dysfunction of thoracic region: Secondary | ICD-10-CM | POA: Diagnosis not present

## 2023-04-27 DIAGNOSIS — E039 Hypothyroidism, unspecified: Secondary | ICD-10-CM | POA: Diagnosis not present

## 2023-04-27 DIAGNOSIS — M542 Cervicalgia: Secondary | ICD-10-CM | POA: Diagnosis not present

## 2023-04-27 DIAGNOSIS — M9903 Segmental and somatic dysfunction of lumbar region: Secondary | ICD-10-CM | POA: Diagnosis not present

## 2023-04-27 DIAGNOSIS — M62830 Muscle spasm of back: Secondary | ICD-10-CM | POA: Diagnosis not present

## 2023-04-27 DIAGNOSIS — M9901 Segmental and somatic dysfunction of cervical region: Secondary | ICD-10-CM | POA: Diagnosis not present

## 2023-04-27 DIAGNOSIS — M546 Pain in thoracic spine: Secondary | ICD-10-CM | POA: Diagnosis not present

## 2023-05-08 DIAGNOSIS — E039 Hypothyroidism, unspecified: Secondary | ICD-10-CM | POA: Diagnosis not present

## 2023-05-08 DIAGNOSIS — E042 Nontoxic multinodular goiter: Secondary | ICD-10-CM | POA: Diagnosis not present

## 2023-05-25 DIAGNOSIS — M542 Cervicalgia: Secondary | ICD-10-CM | POA: Diagnosis not present

## 2023-05-25 DIAGNOSIS — M9903 Segmental and somatic dysfunction of lumbar region: Secondary | ICD-10-CM | POA: Diagnosis not present

## 2023-05-25 DIAGNOSIS — M9901 Segmental and somatic dysfunction of cervical region: Secondary | ICD-10-CM | POA: Diagnosis not present

## 2023-05-25 DIAGNOSIS — M546 Pain in thoracic spine: Secondary | ICD-10-CM | POA: Diagnosis not present

## 2023-05-25 DIAGNOSIS — M9902 Segmental and somatic dysfunction of thoracic region: Secondary | ICD-10-CM | POA: Diagnosis not present

## 2023-05-25 DIAGNOSIS — M62830 Muscle spasm of back: Secondary | ICD-10-CM | POA: Diagnosis not present

## 2023-05-26 DIAGNOSIS — E669 Obesity, unspecified: Secondary | ICD-10-CM | POA: Diagnosis not present

## 2023-05-26 DIAGNOSIS — M810 Age-related osteoporosis without current pathological fracture: Secondary | ICD-10-CM | POA: Diagnosis not present

## 2023-05-26 DIAGNOSIS — E1143 Type 2 diabetes mellitus with diabetic autonomic (poly)neuropathy: Secondary | ICD-10-CM | POA: Diagnosis not present

## 2023-05-26 DIAGNOSIS — E038 Other specified hypothyroidism: Secondary | ICD-10-CM | POA: Diagnosis not present

## 2023-05-26 DIAGNOSIS — J4540 Moderate persistent asthma, uncomplicated: Secondary | ICD-10-CM | POA: Diagnosis not present

## 2023-05-26 DIAGNOSIS — E1121 Type 2 diabetes mellitus with diabetic nephropathy: Secondary | ICD-10-CM | POA: Diagnosis not present

## 2023-05-26 DIAGNOSIS — G4730 Sleep apnea, unspecified: Secondary | ICD-10-CM | POA: Diagnosis not present

## 2023-05-26 DIAGNOSIS — R5382 Chronic fatigue, unspecified: Secondary | ICD-10-CM | POA: Diagnosis not present

## 2023-05-26 DIAGNOSIS — N182 Chronic kidney disease, stage 2 (mild): Secondary | ICD-10-CM | POA: Diagnosis not present

## 2023-05-26 DIAGNOSIS — E215 Disorder of parathyroid gland, unspecified: Secondary | ICD-10-CM | POA: Diagnosis not present

## 2023-05-26 DIAGNOSIS — I10 Essential (primary) hypertension: Secondary | ICD-10-CM | POA: Diagnosis not present

## 2023-06-08 DIAGNOSIS — D351 Benign neoplasm of parathyroid gland: Secondary | ICD-10-CM | POA: Diagnosis not present

## 2023-06-08 DIAGNOSIS — D433 Neoplasm of uncertain behavior of cranial nerves: Secondary | ICD-10-CM | POA: Diagnosis not present

## 2023-06-08 DIAGNOSIS — J3489 Other specified disorders of nose and nasal sinuses: Secondary | ICD-10-CM | POA: Diagnosis not present

## 2023-06-08 DIAGNOSIS — E213 Hyperparathyroidism, unspecified: Secondary | ICD-10-CM | POA: Diagnosis not present

## 2023-06-10 ENCOUNTER — Other Ambulatory Visit: Payer: Self-pay | Admitting: Internal Medicine

## 2023-06-10 DIAGNOSIS — M9902 Segmental and somatic dysfunction of thoracic region: Secondary | ICD-10-CM | POA: Diagnosis not present

## 2023-06-10 DIAGNOSIS — Z1231 Encounter for screening mammogram for malignant neoplasm of breast: Secondary | ICD-10-CM

## 2023-06-10 DIAGNOSIS — M542 Cervicalgia: Secondary | ICD-10-CM | POA: Diagnosis not present

## 2023-06-10 DIAGNOSIS — M62830 Muscle spasm of back: Secondary | ICD-10-CM | POA: Diagnosis not present

## 2023-06-10 DIAGNOSIS — M9901 Segmental and somatic dysfunction of cervical region: Secondary | ICD-10-CM | POA: Diagnosis not present

## 2023-06-10 DIAGNOSIS — M9903 Segmental and somatic dysfunction of lumbar region: Secondary | ICD-10-CM | POA: Diagnosis not present

## 2023-06-10 DIAGNOSIS — M546 Pain in thoracic spine: Secondary | ICD-10-CM | POA: Diagnosis not present

## 2023-06-24 DIAGNOSIS — M9901 Segmental and somatic dysfunction of cervical region: Secondary | ICD-10-CM | POA: Diagnosis not present

## 2023-06-24 DIAGNOSIS — M9902 Segmental and somatic dysfunction of thoracic region: Secondary | ICD-10-CM | POA: Diagnosis not present

## 2023-06-24 DIAGNOSIS — M546 Pain in thoracic spine: Secondary | ICD-10-CM | POA: Diagnosis not present

## 2023-06-24 DIAGNOSIS — M62830 Muscle spasm of back: Secondary | ICD-10-CM | POA: Diagnosis not present

## 2023-06-24 DIAGNOSIS — M542 Cervicalgia: Secondary | ICD-10-CM | POA: Diagnosis not present

## 2023-06-24 DIAGNOSIS — M9903 Segmental and somatic dysfunction of lumbar region: Secondary | ICD-10-CM | POA: Diagnosis not present

## 2023-07-03 ENCOUNTER — Ambulatory Visit
Admission: RE | Admit: 2023-07-03 | Discharge: 2023-07-03 | Disposition: A | Discharge status: Home / Self Care | Payer: Medicare HMO | Source: Ambulatory Visit | Attending: Internal Medicine | Admitting: Internal Medicine

## 2023-07-03 DIAGNOSIS — Z1231 Encounter for screening mammogram for malignant neoplasm of breast: Secondary | ICD-10-CM | POA: Diagnosis not present

## 2023-07-06 DIAGNOSIS — M62830 Muscle spasm of back: Secondary | ICD-10-CM | POA: Diagnosis not present

## 2023-07-06 DIAGNOSIS — M542 Cervicalgia: Secondary | ICD-10-CM | POA: Diagnosis not present

## 2023-07-06 DIAGNOSIS — M546 Pain in thoracic spine: Secondary | ICD-10-CM | POA: Diagnosis not present

## 2023-07-06 DIAGNOSIS — M9902 Segmental and somatic dysfunction of thoracic region: Secondary | ICD-10-CM | POA: Diagnosis not present

## 2023-07-06 DIAGNOSIS — M9903 Segmental and somatic dysfunction of lumbar region: Secondary | ICD-10-CM | POA: Diagnosis not present

## 2023-07-06 DIAGNOSIS — M9901 Segmental and somatic dysfunction of cervical region: Secondary | ICD-10-CM | POA: Diagnosis not present

## 2023-07-13 DIAGNOSIS — M546 Pain in thoracic spine: Secondary | ICD-10-CM | POA: Diagnosis not present

## 2023-07-13 DIAGNOSIS — M9903 Segmental and somatic dysfunction of lumbar region: Secondary | ICD-10-CM | POA: Diagnosis not present

## 2023-07-13 DIAGNOSIS — M542 Cervicalgia: Secondary | ICD-10-CM | POA: Diagnosis not present

## 2023-07-13 DIAGNOSIS — M9901 Segmental and somatic dysfunction of cervical region: Secondary | ICD-10-CM | POA: Diagnosis not present

## 2023-07-13 DIAGNOSIS — M62830 Muscle spasm of back: Secondary | ICD-10-CM | POA: Diagnosis not present

## 2023-07-13 DIAGNOSIS — M9902 Segmental and somatic dysfunction of thoracic region: Secondary | ICD-10-CM | POA: Diagnosis not present

## 2023-07-15 DIAGNOSIS — J45909 Unspecified asthma, uncomplicated: Secondary | ICD-10-CM | POA: Diagnosis not present

## 2023-07-15 DIAGNOSIS — Z6841 Body Mass Index (BMI) 40.0 and over, adult: Secondary | ICD-10-CM | POA: Diagnosis not present

## 2023-07-15 DIAGNOSIS — I10 Essential (primary) hypertension: Secondary | ICD-10-CM | POA: Diagnosis not present

## 2023-07-15 DIAGNOSIS — K219 Gastro-esophageal reflux disease without esophagitis: Secondary | ICD-10-CM | POA: Diagnosis not present

## 2023-07-15 DIAGNOSIS — E119 Type 2 diabetes mellitus without complications: Secondary | ICD-10-CM | POA: Diagnosis not present

## 2023-07-15 DIAGNOSIS — R002 Palpitations: Secondary | ICD-10-CM | POA: Diagnosis not present

## 2023-07-16 DIAGNOSIS — Z01818 Encounter for other preprocedural examination: Secondary | ICD-10-CM | POA: Diagnosis not present

## 2023-07-22 DIAGNOSIS — M9901 Segmental and somatic dysfunction of cervical region: Secondary | ICD-10-CM | POA: Diagnosis not present

## 2023-07-22 DIAGNOSIS — M62830 Muscle spasm of back: Secondary | ICD-10-CM | POA: Diagnosis not present

## 2023-07-22 DIAGNOSIS — M9902 Segmental and somatic dysfunction of thoracic region: Secondary | ICD-10-CM | POA: Diagnosis not present

## 2023-07-22 DIAGNOSIS — M9903 Segmental and somatic dysfunction of lumbar region: Secondary | ICD-10-CM | POA: Diagnosis not present

## 2023-07-22 DIAGNOSIS — M542 Cervicalgia: Secondary | ICD-10-CM | POA: Diagnosis not present

## 2023-07-22 DIAGNOSIS — M546 Pain in thoracic spine: Secondary | ICD-10-CM | POA: Diagnosis not present

## 2023-07-23 DIAGNOSIS — E039 Hypothyroidism, unspecified: Secondary | ICD-10-CM | POA: Diagnosis not present

## 2023-07-23 DIAGNOSIS — E213 Hyperparathyroidism, unspecified: Secondary | ICD-10-CM | POA: Diagnosis not present

## 2023-07-23 DIAGNOSIS — I10 Essential (primary) hypertension: Secondary | ICD-10-CM | POA: Diagnosis not present

## 2023-07-23 DIAGNOSIS — E210 Primary hyperparathyroidism: Secondary | ICD-10-CM | POA: Diagnosis not present

## 2023-07-23 DIAGNOSIS — E119 Type 2 diabetes mellitus without complications: Secondary | ICD-10-CM | POA: Diagnosis not present

## 2023-07-30 DIAGNOSIS — Z9089 Acquired absence of other organs: Secondary | ICD-10-CM | POA: Diagnosis not present

## 2023-07-30 DIAGNOSIS — Z09 Encounter for follow-up examination after completed treatment for conditions other than malignant neoplasm: Secondary | ICD-10-CM | POA: Diagnosis not present

## 2023-07-30 DIAGNOSIS — E213 Hyperparathyroidism, unspecified: Secondary | ICD-10-CM | POA: Diagnosis not present

## 2023-07-30 DIAGNOSIS — Z9889 Other specified postprocedural states: Secondary | ICD-10-CM | POA: Diagnosis not present

## 2023-07-31 DIAGNOSIS — M9903 Segmental and somatic dysfunction of lumbar region: Secondary | ICD-10-CM | POA: Diagnosis not present

## 2023-07-31 DIAGNOSIS — M542 Cervicalgia: Secondary | ICD-10-CM | POA: Diagnosis not present

## 2023-07-31 DIAGNOSIS — M9902 Segmental and somatic dysfunction of thoracic region: Secondary | ICD-10-CM | POA: Diagnosis not present

## 2023-07-31 DIAGNOSIS — M62830 Muscle spasm of back: Secondary | ICD-10-CM | POA: Diagnosis not present

## 2023-07-31 DIAGNOSIS — M9901 Segmental and somatic dysfunction of cervical region: Secondary | ICD-10-CM | POA: Diagnosis not present

## 2023-07-31 DIAGNOSIS — M546 Pain in thoracic spine: Secondary | ICD-10-CM | POA: Diagnosis not present

## 2023-08-24 DIAGNOSIS — M542 Cervicalgia: Secondary | ICD-10-CM | POA: Diagnosis not present

## 2023-08-24 DIAGNOSIS — M546 Pain in thoracic spine: Secondary | ICD-10-CM | POA: Diagnosis not present

## 2023-08-24 DIAGNOSIS — M9903 Segmental and somatic dysfunction of lumbar region: Secondary | ICD-10-CM | POA: Diagnosis not present

## 2023-08-24 DIAGNOSIS — M9901 Segmental and somatic dysfunction of cervical region: Secondary | ICD-10-CM | POA: Diagnosis not present

## 2023-08-24 DIAGNOSIS — M9902 Segmental and somatic dysfunction of thoracic region: Secondary | ICD-10-CM | POA: Diagnosis not present

## 2023-08-24 DIAGNOSIS — M62830 Muscle spasm of back: Secondary | ICD-10-CM | POA: Diagnosis not present

## 2023-09-01 DIAGNOSIS — R5383 Other fatigue: Secondary | ICD-10-CM | POA: Diagnosis not present

## 2023-09-01 DIAGNOSIS — R5382 Chronic fatigue, unspecified: Secondary | ICD-10-CM | POA: Diagnosis not present

## 2023-09-01 DIAGNOSIS — E1121 Type 2 diabetes mellitus with diabetic nephropathy: Secondary | ICD-10-CM | POA: Diagnosis not present

## 2023-09-01 DIAGNOSIS — J4540 Moderate persistent asthma, uncomplicated: Secondary | ICD-10-CM | POA: Diagnosis not present

## 2023-09-01 DIAGNOSIS — E038 Other specified hypothyroidism: Secondary | ICD-10-CM | POA: Diagnosis not present

## 2023-09-01 DIAGNOSIS — E669 Obesity, unspecified: Secondary | ICD-10-CM | POA: Diagnosis not present

## 2023-09-01 DIAGNOSIS — E1122 Type 2 diabetes mellitus with diabetic chronic kidney disease: Secondary | ICD-10-CM | POA: Diagnosis not present

## 2023-09-01 DIAGNOSIS — G4730 Sleep apnea, unspecified: Secondary | ICD-10-CM | POA: Diagnosis not present

## 2023-09-01 DIAGNOSIS — N182 Chronic kidney disease, stage 2 (mild): Secondary | ICD-10-CM | POA: Diagnosis not present

## 2023-09-01 DIAGNOSIS — I10 Essential (primary) hypertension: Secondary | ICD-10-CM | POA: Diagnosis not present

## 2023-09-01 DIAGNOSIS — E1143 Type 2 diabetes mellitus with diabetic autonomic (poly)neuropathy: Secondary | ICD-10-CM | POA: Diagnosis not present

## 2023-09-09 DIAGNOSIS — M9902 Segmental and somatic dysfunction of thoracic region: Secondary | ICD-10-CM | POA: Diagnosis not present

## 2023-09-09 DIAGNOSIS — M546 Pain in thoracic spine: Secondary | ICD-10-CM | POA: Diagnosis not present

## 2023-09-09 DIAGNOSIS — M542 Cervicalgia: Secondary | ICD-10-CM | POA: Diagnosis not present

## 2023-09-09 DIAGNOSIS — M9901 Segmental and somatic dysfunction of cervical region: Secondary | ICD-10-CM | POA: Diagnosis not present

## 2023-09-09 DIAGNOSIS — M9903 Segmental and somatic dysfunction of lumbar region: Secondary | ICD-10-CM | POA: Diagnosis not present

## 2023-09-09 DIAGNOSIS — M62830 Muscle spasm of back: Secondary | ICD-10-CM | POA: Diagnosis not present

## 2023-09-16 DIAGNOSIS — M62830 Muscle spasm of back: Secondary | ICD-10-CM | POA: Diagnosis not present

## 2023-09-16 DIAGNOSIS — M9902 Segmental and somatic dysfunction of thoracic region: Secondary | ICD-10-CM | POA: Diagnosis not present

## 2023-09-16 DIAGNOSIS — M9903 Segmental and somatic dysfunction of lumbar region: Secondary | ICD-10-CM | POA: Diagnosis not present

## 2023-09-16 DIAGNOSIS — M542 Cervicalgia: Secondary | ICD-10-CM | POA: Diagnosis not present

## 2023-09-16 DIAGNOSIS — M9901 Segmental and somatic dysfunction of cervical region: Secondary | ICD-10-CM | POA: Diagnosis not present

## 2023-09-16 DIAGNOSIS — M546 Pain in thoracic spine: Secondary | ICD-10-CM | POA: Diagnosis not present

## 2023-10-05 DIAGNOSIS — M9901 Segmental and somatic dysfunction of cervical region: Secondary | ICD-10-CM | POA: Diagnosis not present

## 2023-10-05 DIAGNOSIS — M542 Cervicalgia: Secondary | ICD-10-CM | POA: Diagnosis not present

## 2023-10-05 DIAGNOSIS — M62830 Muscle spasm of back: Secondary | ICD-10-CM | POA: Diagnosis not present

## 2023-10-05 DIAGNOSIS — M546 Pain in thoracic spine: Secondary | ICD-10-CM | POA: Diagnosis not present

## 2023-10-05 DIAGNOSIS — M9902 Segmental and somatic dysfunction of thoracic region: Secondary | ICD-10-CM | POA: Diagnosis not present

## 2023-10-05 DIAGNOSIS — M9903 Segmental and somatic dysfunction of lumbar region: Secondary | ICD-10-CM | POA: Diagnosis not present

## 2023-10-06 DIAGNOSIS — F32A Depression, unspecified: Secondary | ICD-10-CM | POA: Diagnosis not present

## 2023-10-06 DIAGNOSIS — E1165 Type 2 diabetes mellitus with hyperglycemia: Secondary | ICD-10-CM | POA: Diagnosis not present

## 2023-10-06 DIAGNOSIS — Z6841 Body Mass Index (BMI) 40.0 and over, adult: Secondary | ICD-10-CM | POA: Diagnosis not present

## 2023-10-06 DIAGNOSIS — Q018 Encephalocele of other sites: Secondary | ICD-10-CM | POA: Diagnosis not present

## 2023-10-06 DIAGNOSIS — E1149 Type 2 diabetes mellitus with other diabetic neurological complication: Secondary | ICD-10-CM | POA: Diagnosis not present

## 2023-10-06 DIAGNOSIS — J45909 Unspecified asthma, uncomplicated: Secondary | ICD-10-CM | POA: Diagnosis not present

## 2023-10-06 DIAGNOSIS — D62 Acute posthemorrhagic anemia: Secondary | ICD-10-CM | POA: Diagnosis not present

## 2023-10-06 DIAGNOSIS — I10 Essential (primary) hypertension: Secondary | ICD-10-CM | POA: Diagnosis not present

## 2023-10-06 DIAGNOSIS — E039 Hypothyroidism, unspecified: Secondary | ICD-10-CM | POA: Diagnosis not present

## 2023-10-06 DIAGNOSIS — J323 Chronic sphenoidal sinusitis: Secondary | ICD-10-CM | POA: Diagnosis not present

## 2023-10-06 DIAGNOSIS — G5601 Carpal tunnel syndrome, right upper limb: Secondary | ICD-10-CM | POA: Diagnosis not present

## 2023-10-06 DIAGNOSIS — G9600 Cerebrospinal fluid leak, unspecified: Secondary | ICD-10-CM | POA: Diagnosis not present

## 2023-10-06 DIAGNOSIS — Z7985 Long-term (current) use of injectable non-insulin antidiabetic drugs: Secondary | ICD-10-CM | POA: Diagnosis not present

## 2023-10-06 DIAGNOSIS — G932 Benign intracranial hypertension: Secondary | ICD-10-CM | POA: Diagnosis not present

## 2023-10-06 DIAGNOSIS — G9608 Other cranial cerebrospinal fluid leak: Secondary | ICD-10-CM | POA: Diagnosis not present

## 2023-10-06 DIAGNOSIS — E1169 Type 2 diabetes mellitus with other specified complication: Secondary | ICD-10-CM | POA: Diagnosis not present

## 2023-10-29 DIAGNOSIS — R519 Headache, unspecified: Secondary | ICD-10-CM | POA: Diagnosis not present

## 2023-10-29 DIAGNOSIS — D447 Neoplasm of uncertain behavior of aortic body and other paraganglia: Secondary | ICD-10-CM | POA: Diagnosis not present

## 2023-10-29 DIAGNOSIS — G932 Benign intracranial hypertension: Secondary | ICD-10-CM | POA: Diagnosis not present

## 2023-10-29 DIAGNOSIS — G9600 Cerebrospinal fluid leak, unspecified: Secondary | ICD-10-CM | POA: Diagnosis not present

## 2023-11-10 DIAGNOSIS — Z01818 Encounter for other preprocedural examination: Secondary | ICD-10-CM | POA: Diagnosis not present

## 2023-11-10 DIAGNOSIS — G9601 Cranial cerebrospinal fluid leak, spontaneous: Secondary | ICD-10-CM | POA: Diagnosis not present

## 2023-11-16 DIAGNOSIS — M542 Cervicalgia: Secondary | ICD-10-CM | POA: Diagnosis not present

## 2023-11-16 DIAGNOSIS — M9903 Segmental and somatic dysfunction of lumbar region: Secondary | ICD-10-CM | POA: Diagnosis not present

## 2023-11-16 DIAGNOSIS — M9902 Segmental and somatic dysfunction of thoracic region: Secondary | ICD-10-CM | POA: Diagnosis not present

## 2023-11-16 DIAGNOSIS — M546 Pain in thoracic spine: Secondary | ICD-10-CM | POA: Diagnosis not present

## 2023-11-16 DIAGNOSIS — M62830 Muscle spasm of back: Secondary | ICD-10-CM | POA: Diagnosis not present

## 2023-11-16 DIAGNOSIS — M9901 Segmental and somatic dysfunction of cervical region: Secondary | ICD-10-CM | POA: Diagnosis not present

## 2023-11-19 DIAGNOSIS — G9601 Cranial cerebrospinal fluid leak, spontaneous: Secondary | ICD-10-CM | POA: Diagnosis not present

## 2023-11-19 DIAGNOSIS — Z01818 Encounter for other preprocedural examination: Secondary | ICD-10-CM | POA: Diagnosis not present

## 2023-11-24 DIAGNOSIS — E119 Type 2 diabetes mellitus without complications: Secondary | ICD-10-CM | POA: Diagnosis not present

## 2023-11-24 DIAGNOSIS — Z794 Long term (current) use of insulin: Secondary | ICD-10-CM | POA: Diagnosis not present

## 2023-11-24 DIAGNOSIS — E782 Mixed hyperlipidemia: Secondary | ICD-10-CM | POA: Diagnosis not present

## 2023-11-24 DIAGNOSIS — Z6841 Body Mass Index (BMI) 40.0 and over, adult: Secondary | ICD-10-CM | POA: Diagnosis not present

## 2023-11-24 DIAGNOSIS — I10 Essential (primary) hypertension: Secondary | ICD-10-CM | POA: Diagnosis not present

## 2023-11-26 DIAGNOSIS — E1169 Type 2 diabetes mellitus with other specified complication: Secondary | ICD-10-CM | POA: Diagnosis not present

## 2023-11-26 DIAGNOSIS — E669 Obesity, unspecified: Secondary | ICD-10-CM | POA: Diagnosis not present

## 2023-11-26 DIAGNOSIS — Z794 Long term (current) use of insulin: Secondary | ICD-10-CM | POA: Diagnosis not present

## 2023-11-26 DIAGNOSIS — E1165 Type 2 diabetes mellitus with hyperglycemia: Secondary | ICD-10-CM | POA: Diagnosis not present

## 2023-11-27 DIAGNOSIS — G9600 Cerebrospinal fluid leak, unspecified: Secondary | ICD-10-CM | POA: Diagnosis not present

## 2023-11-27 DIAGNOSIS — E119 Type 2 diabetes mellitus without complications: Secondary | ICD-10-CM | POA: Diagnosis not present

## 2023-11-27 DIAGNOSIS — Z5189 Encounter for other specified aftercare: Secondary | ICD-10-CM | POA: Diagnosis not present

## 2023-11-27 DIAGNOSIS — E039 Hypothyroidism, unspecified: Secondary | ICD-10-CM | POA: Diagnosis not present

## 2023-11-27 DIAGNOSIS — Z794 Long term (current) use of insulin: Secondary | ICD-10-CM | POA: Diagnosis not present

## 2023-11-27 DIAGNOSIS — G932 Benign intracranial hypertension: Secondary | ICD-10-CM | POA: Diagnosis not present

## 2023-11-27 DIAGNOSIS — M797 Fibromyalgia: Secondary | ICD-10-CM | POA: Diagnosis not present

## 2023-11-27 DIAGNOSIS — Z79890 Hormone replacement therapy (postmenopausal): Secondary | ICD-10-CM | POA: Diagnosis not present

## 2023-11-27 DIAGNOSIS — Z7984 Long term (current) use of oral hypoglycemic drugs: Secondary | ICD-10-CM | POA: Diagnosis not present

## 2023-11-27 DIAGNOSIS — G9601 Cranial cerebrospinal fluid leak, spontaneous: Secondary | ICD-10-CM | POA: Diagnosis not present

## 2023-11-27 DIAGNOSIS — I10 Essential (primary) hypertension: Secondary | ICD-10-CM | POA: Diagnosis not present

## 2023-11-30 NOTE — Transitions of Care (Post Inpatient/ED Visit) (Signed)
 11/30/2023  Name: Denise Daniels MRN: [id number] DOB: [birth year]  Today's TOC FU Call Status: Medication reviewed for transitions of care.  See [facility] for documentation.    Items Reviewed:    Medications Reviewed Today: Medications Reviewed Today     Reviewed by Shirlette Scarber, RN (Case Manager) on 11/30/23 at 1359  Med List Status: <None>   Medication Order Taking? Sig Documenting Provider Last Dose Status Informant  acetaminophen  (TYLENOL ) 500 MG tablet [id number] Yes Take 500 mg by mouth. [provider] Taking Active   acetaZOLAMIDE  ER (DIAMOX ) 500 MG capsule [id number] No Take 1 capsule (500 mg total) by mouth 2 (two) times daily.  Patient not taking: Reported on 11/30/2023   Glory Larsen, MD Not Taking Active   ADVAIR DISKUS 500-50 MCG/ACT AEPB [id number] Yes Inhale 1 puff into the lungs in the morning and at bedtime. [provider] Taking Active Self  atenolol  (TENORMIN ) 25 MG tablet [id number] Yes Take 1 tablet (25 mg total) by mouth daily. Luane Rumps, PA-C Taking Active Self  carisoprodol (SOMA) 350 MG tablet [id number] No Take 350 mg by mouth daily.  Patient not taking: Reported on 11/30/2023   [provider] Not Taking Active Self  furosemide (LASIX) 40 MG tablet [id number] Yes Take 40 mg by mouth. [provider] Taking Active Self  gabapentin (NEURONTIN) 300 MG capsule [id number] Yes Take 300 mg by mouth 3 (three) times daily. [provider] Taking Active Self  HYDROcodone-acetaminophen  (NORCO/VICODIN) 5-325 MG tablet [id number] Yes Take 1-2 tablets by mouth. [provider] Taking Active   Insulin  Glargine [facility]) 100 UNIT/ML [id number] Yes Inject 25 Units into the skin. [provider] Taking Active   Insulin  Pen Needle (B-D ULTRAFINE III SHORT PEN) 31G X 8 MM MISC [id number] Yes 90 each by Other route. [provider] Taking Active   levothyroxine  (SYNTHROID ) 137 MCG tablet [id number] Yes  Take 137 mcg by mouth daily before breakfast. [provider] Taking Active Self           Med Note Jayne Mews, Skylur Fuston J   Mon Nov 30, 2023  1:46 PM) 125 MCG Daily  metFORMIN  (GLUCOPHAGE -XR) 500 MG 24 hr tablet [id number] Yes Take 1,000 mg by mouth. [provider] Taking Active   methocarbamol (ROBAXIN) 500 MG tablet [id number] Yes Take 500 mg by mouth. [provider] Taking Active   OZEMPIC, 0.25 OR 0.5 MG/DOSE, 2 MG/3ML SOPN [id number] Yes Inject 0.5 mg into the skin once a week. [provider] Taking Active            Med Note Jayne Mews, Inez Rosato J   Mon Nov 30, 2023  1:46 PM) 1 mg Q week  pantoprazole  (PROTONIX ) 40 MG tablet [id number] Yes Take 40 mg by mouth. [provider] Taking Active   traMADol (ULTRAM) 50 MG tablet [id number] No Take 50 mg by mouth 2 (two) times daily as needed for moderate pain.  Patient not taking: Reported on 11/30/2023   [provider] Not Taking Active Self  triamcinolone  cream (KENALOG ) 0.1 % [id number] Yes Apply 1 Application topically. [provider] Taking Active   venlafaxine XR (EFFEXOR-XR) 150 MG 24 hr capsule [id number] Yes Take 150 mg by mouth daily with breakfast. [provider] Taking Active Self  VENTOLIN HFA 108 (90 Base) MCG/ACT inhaler [id number] Yes Inhale 1-2 puffs into the lungs as needed for shortness of breath or wheezing. [provider] Taking Active Self  vitamin B-12 (CYANOCOBALAMIN ) 500 MCG tablet  [id number] No Take 500 mcg by mouth daily.  Patient not taking: Reported on 11/30/2023   [provider] Not Taking Active Self               Pasquale Bones RN, MSN [facility]  [employer], [facility] RN Care Manager Direct Dial: [phone]  Fax: [phone] Website: [email or web address]

## 2023-12-11 DIAGNOSIS — Z4541 Encounter for adjustment and management of cerebrospinal fluid drainage device: Secondary | ICD-10-CM | POA: Diagnosis not present

## 2023-12-11 DIAGNOSIS — Z982 Presence of cerebrospinal fluid drainage device: Secondary | ICD-10-CM | POA: Diagnosis not present

## 2023-12-11 DIAGNOSIS — G932 Benign intracranial hypertension: Secondary | ICD-10-CM | POA: Diagnosis not present

## 2023-12-24 DIAGNOSIS — G932 Benign intracranial hypertension: Secondary | ICD-10-CM | POA: Diagnosis not present

## 2023-12-24 DIAGNOSIS — Z982 Presence of cerebrospinal fluid drainage device: Secondary | ICD-10-CM | POA: Diagnosis not present

## 2023-12-24 DIAGNOSIS — G9389 Other specified disorders of brain: Secondary | ICD-10-CM | POA: Diagnosis not present

## 2023-12-24 DIAGNOSIS — G9601 Cranial cerebrospinal fluid leak, spontaneous: Secondary | ICD-10-CM | POA: Diagnosis not present

## 2023-12-28 DIAGNOSIS — R5383 Other fatigue: Secondary | ICD-10-CM | POA: Diagnosis not present

## 2023-12-28 DIAGNOSIS — I10 Essential (primary) hypertension: Secondary | ICD-10-CM | POA: Diagnosis not present

## 2023-12-28 DIAGNOSIS — R5382 Chronic fatigue, unspecified: Secondary | ICD-10-CM | POA: Diagnosis not present

## 2023-12-28 DIAGNOSIS — E038 Other specified hypothyroidism: Secondary | ICD-10-CM | POA: Diagnosis not present

## 2023-12-28 DIAGNOSIS — E669 Obesity, unspecified: Secondary | ICD-10-CM | POA: Diagnosis not present

## 2023-12-28 DIAGNOSIS — Z0000 Encounter for general adult medical examination without abnormal findings: Secondary | ICD-10-CM | POA: Diagnosis not present

## 2023-12-28 DIAGNOSIS — J4540 Moderate persistent asthma, uncomplicated: Secondary | ICD-10-CM | POA: Diagnosis not present

## 2023-12-28 DIAGNOSIS — E1122 Type 2 diabetes mellitus with diabetic chronic kidney disease: Secondary | ICD-10-CM | POA: Diagnosis not present

## 2023-12-28 DIAGNOSIS — N182 Chronic kidney disease, stage 2 (mild): Secondary | ICD-10-CM | POA: Diagnosis not present

## 2023-12-28 DIAGNOSIS — G4730 Sleep apnea, unspecified: Secondary | ICD-10-CM | POA: Diagnosis not present

## 2024-01-06 DIAGNOSIS — M9901 Segmental and somatic dysfunction of cervical region: Secondary | ICD-10-CM | POA: Diagnosis not present

## 2024-01-06 DIAGNOSIS — D447 Neoplasm of uncertain behavior of aortic body and other paraganglia: Secondary | ICD-10-CM | POA: Diagnosis not present

## 2024-01-06 DIAGNOSIS — M9902 Segmental and somatic dysfunction of thoracic region: Secondary | ICD-10-CM | POA: Diagnosis not present

## 2024-01-06 DIAGNOSIS — M546 Pain in thoracic spine: Secondary | ICD-10-CM | POA: Diagnosis not present

## 2024-01-06 DIAGNOSIS — M542 Cervicalgia: Secondary | ICD-10-CM | POA: Diagnosis not present

## 2024-01-06 DIAGNOSIS — Z9889 Other specified postprocedural states: Secondary | ICD-10-CM | POA: Diagnosis not present

## 2024-01-06 DIAGNOSIS — Z9089 Acquired absence of other organs: Secondary | ICD-10-CM | POA: Diagnosis not present

## 2024-01-06 DIAGNOSIS — G9600 Cerebrospinal fluid leak, unspecified: Secondary | ICD-10-CM | POA: Diagnosis not present

## 2024-01-06 DIAGNOSIS — M9903 Segmental and somatic dysfunction of lumbar region: Secondary | ICD-10-CM | POA: Diagnosis not present

## 2024-01-06 DIAGNOSIS — G932 Benign intracranial hypertension: Secondary | ICD-10-CM | POA: Diagnosis not present

## 2024-01-06 DIAGNOSIS — M62830 Muscle spasm of back: Secondary | ICD-10-CM | POA: Diagnosis not present

## 2024-01-07 DIAGNOSIS — G9600 Cerebrospinal fluid leak, unspecified: Secondary | ICD-10-CM | POA: Diagnosis not present

## 2024-01-07 DIAGNOSIS — G932 Benign intracranial hypertension: Secondary | ICD-10-CM | POA: Diagnosis not present

## 2024-01-26 DIAGNOSIS — G8929 Other chronic pain: Secondary | ICD-10-CM | POA: Diagnosis not present

## 2024-01-26 DIAGNOSIS — M25512 Pain in left shoulder: Secondary | ICD-10-CM | POA: Diagnosis not present

## 2024-01-29 DIAGNOSIS — G9601 Cranial cerebrospinal fluid leak, spontaneous: Secondary | ICD-10-CM | POA: Diagnosis not present

## 2024-02-08 DIAGNOSIS — M9903 Segmental and somatic dysfunction of lumbar region: Secondary | ICD-10-CM | POA: Diagnosis not present

## 2024-02-08 DIAGNOSIS — M62830 Muscle spasm of back: Secondary | ICD-10-CM | POA: Diagnosis not present

## 2024-02-08 DIAGNOSIS — M9901 Segmental and somatic dysfunction of cervical region: Secondary | ICD-10-CM | POA: Diagnosis not present

## 2024-02-08 DIAGNOSIS — M9902 Segmental and somatic dysfunction of thoracic region: Secondary | ICD-10-CM | POA: Diagnosis not present

## 2024-02-08 DIAGNOSIS — M546 Pain in thoracic spine: Secondary | ICD-10-CM | POA: Diagnosis not present

## 2024-02-08 DIAGNOSIS — M542 Cervicalgia: Secondary | ICD-10-CM | POA: Diagnosis not present

## 2024-02-18 DIAGNOSIS — G9601 Cranial cerebrospinal fluid leak, spontaneous: Secondary | ICD-10-CM | POA: Diagnosis not present

## 2024-02-24 DIAGNOSIS — J320 Chronic maxillary sinusitis: Secondary | ICD-10-CM | POA: Diagnosis not present

## 2024-02-24 DIAGNOSIS — E1149 Type 2 diabetes mellitus with other diabetic neurological complication: Secondary | ICD-10-CM | POA: Diagnosis not present

## 2024-02-24 DIAGNOSIS — E1165 Type 2 diabetes mellitus with hyperglycemia: Secondary | ICD-10-CM | POA: Diagnosis not present

## 2024-02-24 DIAGNOSIS — E1122 Type 2 diabetes mellitus with diabetic chronic kidney disease: Secondary | ICD-10-CM | POA: Diagnosis not present

## 2024-02-24 DIAGNOSIS — N182 Chronic kidney disease, stage 2 (mild): Secondary | ICD-10-CM | POA: Diagnosis not present

## 2024-02-24 DIAGNOSIS — E669 Obesity, unspecified: Secondary | ICD-10-CM | POA: Diagnosis not present

## 2024-02-24 DIAGNOSIS — G9601 Cranial cerebrospinal fluid leak, spontaneous: Secondary | ICD-10-CM | POA: Diagnosis not present

## 2024-02-24 DIAGNOSIS — I129 Hypertensive chronic kidney disease with stage 1 through stage 4 chronic kidney disease, or unspecified chronic kidney disease: Secondary | ICD-10-CM | POA: Diagnosis not present

## 2024-02-24 DIAGNOSIS — Z794 Long term (current) use of insulin: Secondary | ICD-10-CM | POA: Diagnosis not present

## 2024-02-24 DIAGNOSIS — E1142 Type 2 diabetes mellitus with diabetic polyneuropathy: Secondary | ICD-10-CM | POA: Diagnosis not present

## 2024-02-24 DIAGNOSIS — E1169 Type 2 diabetes mellitus with other specified complication: Secondary | ICD-10-CM | POA: Diagnosis not present

## 2024-02-26 ENCOUNTER — Telehealth: Payer: Self-pay

## 2024-02-26 NOTE — Patient Instructions (Signed)
 Visit Information  Thank you for taking time to visit with me today. Please don't hesitate to contact me if I can be of assistance to you.  Reviewed with patient :Call your doctor or go to the emergency room if you have: - Fever of 101.5 degrees or higher - Severe pain that has increased greatly since your surgery or is uncontrolled by your current pain medications - Increased redness around your incision (cut); incision pulling apart; thick, white drainage (pus), bleeding, or a large amount of fluid from your incision. - Nausea and vomiting that does not go away - Chest pain/shortness of breath - Any other acute events, problems, or concerns Avoid heavy lifting, straining or strenuous activities until instructed otherwise - It is very important to avoid anything that causes pressure in your head - bending over, straining, lifting more than 5 pounds and sneezing for at least 2 weeks. DO NOT BLOW YOUR NOSE.   Patient verbalizes understanding of instructions and care plan provided today and agrees to view in MyChart. Active MyChart status and patient understanding of how to access instructions and care plan via MyChart confirmed with patient.     The patient has been provided with contact information for the care management team and has been advised to call with any health related questions or concerns.   Please call the care guide team at [phone] if you need to cancel or reschedule your appointment.   Please call the Suicide and Crisis Lifeline: 988 if you are experiencing a Mental Health or Behavioral Health Crisis or need someone to talk to.  Karsynn Deweese J. Aubra Pappalardo RN, MSN [facility], [facility] RN Care Manager Direct Dial: [phone]  Fax: [phone] Website: [email or web address]

## 2024-02-26 NOTE — Transitions of Care (Post Inpatient/ED Visit) (Signed)
 02/26/2024  Name: Denise Daniels MRN: [id number] DOB: [birth year]  Today's TOC FU Call Status: Today's TOC FU Call Status:: Successful TOC FU Call Completed TOC FU Call Complete Date: 02/26/24 Patient's Name and Date of Birth confirmed.  Transition Care Management Follow-up Telephone Call Date of Discharge: 02/25/24 Discharge Facility: Other [facility]) Name of Other (Non[facility]) Discharge Facility: [facility] Type of Discharge: Inpatient Admission Primary Inpatient Discharge Diagnosis:: Cranial cerebrospinal fluid leak, spontaneous How have you been since you were released from the hospital?: Better Any questions or concerns?: No  Items Reviewed: Did you receive and understand the discharge instructions provided?: Yes Medications obtained,verified, and reconciled?: Yes (Medications Reviewed) Any new allergies since your discharge?: No Dietary orders reviewed?: Yes Type of Diet Ordered:: heart healthy Carb modified Do you have support at home?: Yes People in Home [RPT]: spouse Name of Support/Comfort Primary Source: spouse  Medications Reviewed Today: Medications Reviewed Today     Reviewed by Kathlee Barnhardt, RN (Case Manager) on 02/26/24 at 1207  Med List Status: <None>   Medication Order Taking? Sig Documenting Provider Last Dose Status Informant  acetaminophen  (TYLENOL ) 500 MG tablet [id number] Yes Take 500 mg by mouth. [provider]  Active   acetaZOLAMIDE  ER (DIAMOX ) 500 MG capsule [id number]  Take 1 capsule (500 mg total) by mouth 2 (two) times daily.  Patient not taking: Reported on 11/30/2023   Ines Onetha NOVAK, MD  Active   ADVAIR DISKUS 500-50 MCG/ACT AEPB [id number]  Inhale 1 puff into the lungs in the morning and at bedtime. [provider]  Active Self  atenolol  (TENORMIN ) 25 MG tablet [id number] Yes Take 1 tablet (25 mg total) by mouth daily. Comer Kirsch, PA-C  Active Self  Blood Glucose Monitoring Suppl (ACCU-CHEK  GUIDE) w/Device KIT [id number] Yes Apply 1 each topically. [provider]  Active   carisoprodol (SOMA) 350 MG tablet [id number] Yes Take 350 mg by mouth daily. [provider]  Active Self  furosemide (LASIX) 40 MG tablet [id number] Yes Take 40 mg by mouth. [provider]  Active Self  gabapentin (NEURONTIN) 300 MG capsule [id number] Yes Take 300 mg by mouth 3 (three) times daily. [provider]  Active Self  Insulin  Glargine [facility] KWIKPEN) 100 UNIT/ML [id number]  Inject 25 Units into the skin.  Patient not taking: Reported on 02/26/2024   [provider]  Active   levothyroxine  (SYNTHROID ) 137 MCG tablet [id number] Yes Take 137 mcg by mouth daily before breakfast.  Patient taking differently: Take 125 mcg by mouth daily before breakfast.   [provider]  Active Self           Med Note LUCIAN, Willow Shidler J   Mon Nov 30, 2023  1:46 PM) 125 MCG Daily  metFORMIN  (GLUCOPHAGE -XR) 500 MG 24 hr tablet [id number]  Take 1,000 mg by mouth.  Patient not taking: Reported on 02/26/2024   [provider]  Active   OZEMPIC, 0.25 OR 0.5 MG/DOSE, 2 MG/3ML SOPN [id number] Yes Inject 0.5 mg into the skin once a week.  Patient taking differently: Inject 1 mg into the skin once a week.   [provider]  Active            Med Note LUCIAN, Laneka Mcgrory J   Mon Nov 30, 2023  1:46 PM) 1 mg Q week  pantoprazole  (PROTONIX ) 40 MG tablet [id number] Yes Take 40 mg by mouth. [provider]  Active   traMADol (ULTRAM) 50 MG tablet  [id number] Yes Take 50 mg by mouth 2 (two) times daily as needed for moderate pain. [provider]  Active Self  triamcinolone  cream (KENALOG ) 0.1 % [id number] Yes Apply 1 Application topically. [provider]  Active   venlafaxine XR (EFFEXOR-XR) 150 MG 24 hr capsule [id number] Yes Take 150 mg by mouth daily with breakfast. [provider]  Active Self  VENTOLIN HFA 108 (90 Base) MCG/ACT inhaler  [id number] Yes Inhale 1-2 puffs into the lungs as needed for shortness of breath or wheezing. [provider]  Active Self  vitamin B-12 (CYANOCOBALAMIN ) 500 MCG tablet [id number]  Take 500 mcg by mouth daily.  Patient not taking: Reported on 02/26/2024   [provider]  Active Self            Home Care and Equipment/Supplies: Were Home Health Services Ordered?: NA Any new equipment or medical supplies ordered?: NA  Functional Questionnaire: Do you need assistance with bathing/showering or dressing?: No Do you need assistance with meal preparation?: No Do you need assistance with eating?: No Do you have difficulty maintaining continence: No Do you need assistance with getting out of bed/getting out of a chair/moving?: No Do you have difficulty managing or taking your medications?: No  Follow up appointments reviewed: PCP Follow-up appointment confirmed?: Yes Date of PCP follow-up appointment?:  (Patient states she has an appointment but does not have the date right now) Follow-up Provider: Dr. Orpha Driscilla Salvage Follow-up appointment confirmed?: Yes Date of Specialist follow-up appointment?: 03/02/24 Follow-Up Specialty Provider:: Dr. Loria Do you need transportation to your follow-up appointment?: No Do you understand care options if your condition(s) worsen?: Yes-patient verbalized understanding (Reviewed discharge instructions with patient. She verbalized understanding.)  SDOH Interventions Today    Flowsheet Row Most Recent Value  SDOH Interventions   Food Insecurity Interventions Intervention Not Indicated  Housing Interventions Intervention Not Indicated  Transportation Interventions Intervention Not Indicated  Utilities Interventions Intervention Not Indicated    Rosalee Tolley J. Wyn Nettle RN, MSN [facility]  [facility], [facility] RN Care Manager Direct Dial: [phone]  Fax: [phone] Website: [email or web address]

## 2024-03-02 DIAGNOSIS — Z9889 Other specified postprocedural states: Secondary | ICD-10-CM | POA: Diagnosis not present

## 2024-03-02 DIAGNOSIS — G9601 Cranial cerebrospinal fluid leak, spontaneous: Secondary | ICD-10-CM | POA: Diagnosis not present

## 2024-03-02 DIAGNOSIS — Z6838 Body mass index (BMI) 38.0-38.9, adult: Secondary | ICD-10-CM | POA: Diagnosis not present

## 2024-03-02 DIAGNOSIS — J3489 Other specified disorders of nose and nasal sinuses: Secondary | ICD-10-CM | POA: Diagnosis not present

## 2024-03-02 DIAGNOSIS — J320 Chronic maxillary sinusitis: Secondary | ICD-10-CM | POA: Diagnosis not present

## 2024-03-02 DIAGNOSIS — G932 Benign intracranial hypertension: Secondary | ICD-10-CM | POA: Diagnosis not present

## 2024-03-02 DIAGNOSIS — G4733 Obstructive sleep apnea (adult) (pediatric): Secondary | ICD-10-CM | POA: Diagnosis not present

## 2024-03-02 DIAGNOSIS — G9602 Spinal cerebrospinal fluid leak, spontaneous: Secondary | ICD-10-CM | POA: Diagnosis not present

## 2024-03-11 DIAGNOSIS — E1122 Type 2 diabetes mellitus with diabetic chronic kidney disease: Secondary | ICD-10-CM | POA: Diagnosis not present

## 2024-03-11 DIAGNOSIS — G9602 Spinal cerebrospinal fluid leak, spontaneous: Secondary | ICD-10-CM | POA: Diagnosis not present

## 2024-03-11 DIAGNOSIS — N186 End stage renal disease: Secondary | ICD-10-CM | POA: Diagnosis not present

## 2024-03-15 DIAGNOSIS — T148XXA Other injury of unspecified body region, initial encounter: Secondary | ICD-10-CM | POA: Diagnosis not present

## 2024-03-15 DIAGNOSIS — G9600 Cerebrospinal fluid leak, unspecified: Secondary | ICD-10-CM | POA: Diagnosis not present

## 2024-03-16 DIAGNOSIS — M546 Pain in thoracic spine: Secondary | ICD-10-CM | POA: Diagnosis not present

## 2024-03-16 DIAGNOSIS — M9901 Segmental and somatic dysfunction of cervical region: Secondary | ICD-10-CM | POA: Diagnosis not present

## 2024-03-16 DIAGNOSIS — M9903 Segmental and somatic dysfunction of lumbar region: Secondary | ICD-10-CM | POA: Diagnosis not present

## 2024-03-16 DIAGNOSIS — M542 Cervicalgia: Secondary | ICD-10-CM | POA: Diagnosis not present

## 2024-03-16 DIAGNOSIS — M62830 Muscle spasm of back: Secondary | ICD-10-CM | POA: Diagnosis not present

## 2024-03-16 DIAGNOSIS — M9902 Segmental and somatic dysfunction of thoracic region: Secondary | ICD-10-CM | POA: Diagnosis not present

## 2024-03-23 DIAGNOSIS — G4733 Obstructive sleep apnea (adult) (pediatric): Secondary | ICD-10-CM | POA: Diagnosis not present

## 2024-03-23 DIAGNOSIS — G932 Benign intracranial hypertension: Secondary | ICD-10-CM | POA: Diagnosis not present

## 2024-03-23 DIAGNOSIS — J320 Chronic maxillary sinusitis: Secondary | ICD-10-CM | POA: Diagnosis not present

## 2024-03-24 DIAGNOSIS — H524 Presbyopia: Secondary | ICD-10-CM | POA: Diagnosis not present

## 2024-04-04 DIAGNOSIS — M546 Pain in thoracic spine: Secondary | ICD-10-CM | POA: Diagnosis not present

## 2024-04-04 DIAGNOSIS — M62830 Muscle spasm of back: Secondary | ICD-10-CM | POA: Diagnosis not present

## 2024-04-04 DIAGNOSIS — M9902 Segmental and somatic dysfunction of thoracic region: Secondary | ICD-10-CM | POA: Diagnosis not present

## 2024-04-04 DIAGNOSIS — Z0100 Encounter for examination of eyes and vision without abnormal findings: Secondary | ICD-10-CM | POA: Diagnosis not present

## 2024-04-04 DIAGNOSIS — M9901 Segmental and somatic dysfunction of cervical region: Secondary | ICD-10-CM | POA: Diagnosis not present

## 2024-04-04 DIAGNOSIS — M9903 Segmental and somatic dysfunction of lumbar region: Secondary | ICD-10-CM | POA: Diagnosis not present

## 2024-04-04 DIAGNOSIS — M542 Cervicalgia: Secondary | ICD-10-CM | POA: Diagnosis not present

## 2024-04-05 DIAGNOSIS — E1122 Type 2 diabetes mellitus with diabetic chronic kidney disease: Secondary | ICD-10-CM | POA: Diagnosis not present

## 2024-04-05 DIAGNOSIS — N1831 Chronic kidney disease, stage 3a: Secondary | ICD-10-CM | POA: Diagnosis not present

## 2024-04-05 DIAGNOSIS — Z79899 Other long term (current) drug therapy: Secondary | ICD-10-CM | POA: Diagnosis not present

## 2024-04-05 DIAGNOSIS — G4730 Sleep apnea, unspecified: Secondary | ICD-10-CM | POA: Diagnosis not present

## 2024-04-05 DIAGNOSIS — I10 Essential (primary) hypertension: Secondary | ICD-10-CM | POA: Diagnosis not present

## 2024-04-05 DIAGNOSIS — G9602 Spinal cerebrospinal fluid leak, spontaneous: Secondary | ICD-10-CM | POA: Diagnosis not present

## 2024-04-05 DIAGNOSIS — E7849 Other hyperlipidemia: Secondary | ICD-10-CM | POA: Diagnosis not present

## 2024-04-05 DIAGNOSIS — E038 Other specified hypothyroidism: Secondary | ICD-10-CM | POA: Diagnosis not present

## 2024-04-05 DIAGNOSIS — J4540 Moderate persistent asthma, uncomplicated: Secondary | ICD-10-CM | POA: Diagnosis not present

## 2024-04-05 DIAGNOSIS — R5383 Other fatigue: Secondary | ICD-10-CM | POA: Diagnosis not present

## 2024-04-07 DIAGNOSIS — E213 Hyperparathyroidism, unspecified: Secondary | ICD-10-CM | POA: Diagnosis not present

## 2024-04-13 DIAGNOSIS — G9601 Cranial cerebrospinal fluid leak, spontaneous: Secondary | ICD-10-CM | POA: Diagnosis not present

## 2024-04-13 DIAGNOSIS — D447 Neoplasm of uncertain behavior of aortic body and other paraganglia: Secondary | ICD-10-CM | POA: Diagnosis not present

## 2024-04-13 DIAGNOSIS — E213 Hyperparathyroidism, unspecified: Secondary | ICD-10-CM | POA: Diagnosis not present

## 2024-04-18 DIAGNOSIS — M9903 Segmental and somatic dysfunction of lumbar region: Secondary | ICD-10-CM | POA: Diagnosis not present

## 2024-04-18 DIAGNOSIS — M542 Cervicalgia: Secondary | ICD-10-CM | POA: Diagnosis not present

## 2024-04-18 DIAGNOSIS — M546 Pain in thoracic spine: Secondary | ICD-10-CM | POA: Diagnosis not present

## 2024-04-18 DIAGNOSIS — M9901 Segmental and somatic dysfunction of cervical region: Secondary | ICD-10-CM | POA: Diagnosis not present

## 2024-04-18 DIAGNOSIS — M62830 Muscle spasm of back: Secondary | ICD-10-CM | POA: Diagnosis not present

## 2024-04-18 DIAGNOSIS — M9902 Segmental and somatic dysfunction of thoracic region: Secondary | ICD-10-CM | POA: Diagnosis not present

## 2024-04-26 DIAGNOSIS — H524 Presbyopia: Secondary | ICD-10-CM | POA: Diagnosis not present

## 2024-05-03 DIAGNOSIS — H4711 Papilledema associated with increased intracranial pressure: Secondary | ICD-10-CM | POA: Diagnosis not present

## 2024-05-04 DIAGNOSIS — M9902 Segmental and somatic dysfunction of thoracic region: Secondary | ICD-10-CM | POA: Diagnosis not present

## 2024-05-04 DIAGNOSIS — M62830 Muscle spasm of back: Secondary | ICD-10-CM | POA: Diagnosis not present

## 2024-05-04 DIAGNOSIS — M9901 Segmental and somatic dysfunction of cervical region: Secondary | ICD-10-CM | POA: Diagnosis not present

## 2024-05-04 DIAGNOSIS — M9903 Segmental and somatic dysfunction of lumbar region: Secondary | ICD-10-CM | POA: Diagnosis not present

## 2024-05-04 DIAGNOSIS — M546 Pain in thoracic spine: Secondary | ICD-10-CM | POA: Diagnosis not present

## 2024-05-04 DIAGNOSIS — M542 Cervicalgia: Secondary | ICD-10-CM | POA: Diagnosis not present

## 2024-05-13 DIAGNOSIS — D447 Neoplasm of uncertain behavior of aortic body and other paraganglia: Secondary | ICD-10-CM | POA: Diagnosis not present

## 2024-05-13 DIAGNOSIS — Q278 Other specified congenital malformations of peripheral vascular system: Secondary | ICD-10-CM | POA: Diagnosis not present

## 2024-05-13 DIAGNOSIS — E213 Hyperparathyroidism, unspecified: Secondary | ICD-10-CM | POA: Diagnosis not present

## 2024-05-18 DIAGNOSIS — M9902 Segmental and somatic dysfunction of thoracic region: Secondary | ICD-10-CM | POA: Diagnosis not present

## 2024-05-18 DIAGNOSIS — M9903 Segmental and somatic dysfunction of lumbar region: Secondary | ICD-10-CM | POA: Diagnosis not present

## 2024-05-18 DIAGNOSIS — M9901 Segmental and somatic dysfunction of cervical region: Secondary | ICD-10-CM | POA: Diagnosis not present

## 2024-05-18 DIAGNOSIS — M546 Pain in thoracic spine: Secondary | ICD-10-CM | POA: Diagnosis not present

## 2024-05-18 DIAGNOSIS — M542 Cervicalgia: Secondary | ICD-10-CM | POA: Diagnosis not present

## 2024-05-18 DIAGNOSIS — M62830 Muscle spasm of back: Secondary | ICD-10-CM | POA: Diagnosis not present

## 2024-05-20 ENCOUNTER — Other Ambulatory Visit: Payer: Self-pay | Admitting: Internal Medicine

## 2024-05-20 ENCOUNTER — Encounter: Payer: Self-pay | Admitting: Internal Medicine

## 2024-05-20 DIAGNOSIS — Z1231 Encounter for screening mammogram for malignant neoplasm of breast: Secondary | ICD-10-CM

## 2024-05-26 DIAGNOSIS — M170 Bilateral primary osteoarthritis of knee: Secondary | ICD-10-CM | POA: Diagnosis not present

## 2024-06-03 DIAGNOSIS — M542 Cervicalgia: Secondary | ICD-10-CM | POA: Diagnosis not present

## 2024-06-03 DIAGNOSIS — M546 Pain in thoracic spine: Secondary | ICD-10-CM | POA: Diagnosis not present

## 2024-06-03 DIAGNOSIS — M9903 Segmental and somatic dysfunction of lumbar region: Secondary | ICD-10-CM | POA: Diagnosis not present

## 2024-06-03 DIAGNOSIS — M9901 Segmental and somatic dysfunction of cervical region: Secondary | ICD-10-CM | POA: Diagnosis not present

## 2024-06-03 DIAGNOSIS — M9902 Segmental and somatic dysfunction of thoracic region: Secondary | ICD-10-CM | POA: Diagnosis not present

## 2024-06-03 DIAGNOSIS — M62830 Muscle spasm of back: Secondary | ICD-10-CM | POA: Diagnosis not present

## 2024-06-06 DIAGNOSIS — E785 Hyperlipidemia, unspecified: Secondary | ICD-10-CM | POA: Diagnosis not present

## 2024-06-06 DIAGNOSIS — I129 Hypertensive chronic kidney disease with stage 1 through stage 4 chronic kidney disease, or unspecified chronic kidney disease: Secondary | ICD-10-CM | POA: Diagnosis not present

## 2024-06-06 DIAGNOSIS — J45909 Unspecified asthma, uncomplicated: Secondary | ICD-10-CM | POA: Diagnosis not present

## 2024-06-06 DIAGNOSIS — I871 Compression of vein: Secondary | ICD-10-CM | POA: Diagnosis not present

## 2024-06-06 DIAGNOSIS — D433 Neoplasm of uncertain behavior of cranial nerves: Secondary | ICD-10-CM | POA: Diagnosis not present

## 2024-06-06 DIAGNOSIS — G43909 Migraine, unspecified, not intractable, without status migrainosus: Secondary | ICD-10-CM | POA: Diagnosis not present

## 2024-06-06 DIAGNOSIS — K5900 Constipation, unspecified: Secondary | ICD-10-CM | POA: Diagnosis not present

## 2024-06-06 DIAGNOSIS — G9340 Encephalopathy, unspecified: Secondary | ICD-10-CM | POA: Diagnosis not present

## 2024-06-06 DIAGNOSIS — D329 Benign neoplasm of meninges, unspecified: Secondary | ICD-10-CM | POA: Diagnosis not present

## 2024-06-06 DIAGNOSIS — R32 Unspecified urinary incontinence: Secondary | ICD-10-CM | POA: Diagnosis not present

## 2024-06-06 DIAGNOSIS — N189 Chronic kidney disease, unspecified: Secondary | ICD-10-CM | POA: Diagnosis not present

## 2024-06-06 DIAGNOSIS — K219 Gastro-esophageal reflux disease without esophagitis: Secondary | ICD-10-CM | POA: Diagnosis not present

## 2024-06-06 DIAGNOSIS — Z809 Family history of malignant neoplasm, unspecified: Secondary | ICD-10-CM | POA: Diagnosis not present

## 2024-06-06 DIAGNOSIS — M797 Fibromyalgia: Secondary | ICD-10-CM | POA: Diagnosis not present

## 2024-06-06 DIAGNOSIS — E049 Nontoxic goiter, unspecified: Secondary | ICD-10-CM | POA: Diagnosis not present

## 2024-06-06 DIAGNOSIS — Z823 Family history of stroke: Secondary | ICD-10-CM | POA: Diagnosis not present

## 2024-06-06 DIAGNOSIS — E1140 Type 2 diabetes mellitus with diabetic neuropathy, unspecified: Secondary | ICD-10-CM | POA: Diagnosis not present

## 2024-06-06 DIAGNOSIS — E1122 Type 2 diabetes mellitus with diabetic chronic kidney disease: Secondary | ICD-10-CM | POA: Diagnosis not present

## 2024-06-06 DIAGNOSIS — G519 Disorder of facial nerve, unspecified: Secondary | ICD-10-CM | POA: Diagnosis not present

## 2024-06-06 DIAGNOSIS — E1151 Type 2 diabetes mellitus with diabetic peripheral angiopathy without gangrene: Secondary | ICD-10-CM | POA: Diagnosis not present

## 2024-06-06 DIAGNOSIS — F324 Major depressive disorder, single episode, in partial remission: Secondary | ICD-10-CM | POA: Diagnosis not present

## 2024-06-06 DIAGNOSIS — Q279 Congenital malformation of peripheral vascular system, unspecified: Secondary | ICD-10-CM | POA: Diagnosis not present

## 2024-06-06 DIAGNOSIS — G932 Benign intracranial hypertension: Secondary | ICD-10-CM | POA: Diagnosis not present

## 2024-06-10 DIAGNOSIS — M9903 Segmental and somatic dysfunction of lumbar region: Secondary | ICD-10-CM | POA: Diagnosis not present

## 2024-06-10 DIAGNOSIS — M62830 Muscle spasm of back: Secondary | ICD-10-CM | POA: Diagnosis not present

## 2024-06-10 DIAGNOSIS — M542 Cervicalgia: Secondary | ICD-10-CM | POA: Diagnosis not present

## 2024-06-10 DIAGNOSIS — M546 Pain in thoracic spine: Secondary | ICD-10-CM | POA: Diagnosis not present

## 2024-06-10 DIAGNOSIS — M9902 Segmental and somatic dysfunction of thoracic region: Secondary | ICD-10-CM | POA: Diagnosis not present

## 2024-06-10 DIAGNOSIS — M9901 Segmental and somatic dysfunction of cervical region: Secondary | ICD-10-CM | POA: Diagnosis not present

## 2024-06-13 DIAGNOSIS — G9601 Cranial cerebrospinal fluid leak, spontaneous: Secondary | ICD-10-CM | POA: Diagnosis not present

## 2024-06-13 DIAGNOSIS — G932 Benign intracranial hypertension: Secondary | ICD-10-CM | POA: Diagnosis not present

## 2024-06-13 DIAGNOSIS — J320 Chronic maxillary sinusitis: Secondary | ICD-10-CM | POA: Diagnosis not present

## 2024-06-13 DIAGNOSIS — G4733 Obstructive sleep apnea (adult) (pediatric): Secondary | ICD-10-CM | POA: Diagnosis not present

## 2024-06-22 DIAGNOSIS — M9901 Segmental and somatic dysfunction of cervical region: Secondary | ICD-10-CM | POA: Diagnosis not present

## 2024-06-22 DIAGNOSIS — M9902 Segmental and somatic dysfunction of thoracic region: Secondary | ICD-10-CM | POA: Diagnosis not present

## 2024-06-22 DIAGNOSIS — M9903 Segmental and somatic dysfunction of lumbar region: Secondary | ICD-10-CM | POA: Diagnosis not present

## 2024-06-22 DIAGNOSIS — M542 Cervicalgia: Secondary | ICD-10-CM | POA: Diagnosis not present

## 2024-06-22 DIAGNOSIS — M546 Pain in thoracic spine: Secondary | ICD-10-CM | POA: Diagnosis not present

## 2024-06-22 DIAGNOSIS — M62830 Muscle spasm of back: Secondary | ICD-10-CM | POA: Diagnosis not present

## 2024-06-23 DIAGNOSIS — M62838 Other muscle spasm: Secondary | ICD-10-CM | POA: Diagnosis not present

## 2024-07-04 ENCOUNTER — Ambulatory Visit
Admission: RE | Admit: 2024-07-04 | Discharge: 2024-07-04 | Disposition: A | Source: Ambulatory Visit | Attending: Internal Medicine | Admitting: Internal Medicine

## 2024-07-04 DIAGNOSIS — Z1231 Encounter for screening mammogram for malignant neoplasm of breast: Secondary | ICD-10-CM | POA: Diagnosis not present

## 2024-07-05 DIAGNOSIS — M62830 Muscle spasm of back: Secondary | ICD-10-CM | POA: Diagnosis not present

## 2024-07-05 DIAGNOSIS — M9903 Segmental and somatic dysfunction of lumbar region: Secondary | ICD-10-CM | POA: Diagnosis not present

## 2024-07-05 DIAGNOSIS — M9902 Segmental and somatic dysfunction of thoracic region: Secondary | ICD-10-CM | POA: Diagnosis not present

## 2024-07-05 DIAGNOSIS — M542 Cervicalgia: Secondary | ICD-10-CM | POA: Diagnosis not present

## 2024-07-05 DIAGNOSIS — M546 Pain in thoracic spine: Secondary | ICD-10-CM | POA: Diagnosis not present

## 2024-07-05 DIAGNOSIS — M9901 Segmental and somatic dysfunction of cervical region: Secondary | ICD-10-CM | POA: Diagnosis not present

## 2024-07-12 DIAGNOSIS — E1122 Type 2 diabetes mellitus with diabetic chronic kidney disease: Secondary | ICD-10-CM | POA: Diagnosis not present

## 2024-07-12 DIAGNOSIS — G4730 Sleep apnea, unspecified: Secondary | ICD-10-CM | POA: Diagnosis not present

## 2024-07-12 DIAGNOSIS — E7849 Other hyperlipidemia: Secondary | ICD-10-CM | POA: Diagnosis not present

## 2024-07-12 DIAGNOSIS — M25569 Pain in unspecified knee: Secondary | ICD-10-CM | POA: Diagnosis not present

## 2024-07-12 DIAGNOSIS — I10 Essential (primary) hypertension: Secondary | ICD-10-CM | POA: Diagnosis not present

## 2024-07-12 DIAGNOSIS — J4540 Moderate persistent asthma, uncomplicated: Secondary | ICD-10-CM | POA: Diagnosis not present

## 2024-07-12 DIAGNOSIS — G9602 Spinal cerebrospinal fluid leak, spontaneous: Secondary | ICD-10-CM | POA: Diagnosis not present

## 2024-07-12 DIAGNOSIS — R5383 Other fatigue: Secondary | ICD-10-CM | POA: Diagnosis not present

## 2024-07-12 DIAGNOSIS — E038 Other specified hypothyroidism: Secondary | ICD-10-CM | POA: Diagnosis not present

## 2024-07-12 DIAGNOSIS — N1831 Chronic kidney disease, stage 3a: Secondary | ICD-10-CM | POA: Diagnosis not present
